# Patient Record
Sex: Female | Born: 1975 | Hispanic: Yes | Marital: Single | Smoking: Current some day smoker
Health system: Southern US, Community
[De-identification: ages and names within clinical notes are randomized; demographics above are authoritative.]

## PROBLEM LIST (undated history)

## (undated) DIAGNOSIS — M549 Dorsalgia, unspecified: Secondary | ICD-10-CM

## (undated) DIAGNOSIS — F32A Depression, unspecified: Secondary | ICD-10-CM

## (undated) DIAGNOSIS — F419 Anxiety disorder, unspecified: Secondary | ICD-10-CM

## (undated) DIAGNOSIS — F909 Attention-deficit hyperactivity disorder, unspecified type: Secondary | ICD-10-CM

## (undated) DIAGNOSIS — G43909 Migraine, unspecified, not intractable, without status migrainosus: Secondary | ICD-10-CM

## (undated) DIAGNOSIS — F319 Bipolar disorder, unspecified: Secondary | ICD-10-CM

## (undated) DIAGNOSIS — M5430 Sciatica, unspecified side: Secondary | ICD-10-CM

## (undated) DIAGNOSIS — F609 Personality disorder, unspecified: Secondary | ICD-10-CM

## (undated) DIAGNOSIS — K219 Gastro-esophageal reflux disease without esophagitis: Secondary | ICD-10-CM

## (undated) HISTORY — PX: TUBAL LIGATION: SHX77

## (undated) HISTORY — PX: BACK SURGERY: SHX140

## (undated) HISTORY — PX: ABDOMINAL HYSTERECTOMY: SHX81

## (undated) HISTORY — PX: APPENDECTOMY: SHX54

---

## 2014-03-09 HISTORY — PX: POSTERIOR LAMINECTOMY / DECOMPRESSION LUMBAR SPINE: SUR740

## 2015-07-14 ENCOUNTER — Encounter (HOSPITAL_BASED_OUTPATIENT_CLINIC_OR_DEPARTMENT_OTHER): Payer: Self-pay | Admitting: Emergency Medicine

## 2015-07-14 ENCOUNTER — Emergency Department (HOSPITAL_BASED_OUTPATIENT_CLINIC_OR_DEPARTMENT_OTHER): Payer: Self-pay

## 2015-07-14 ENCOUNTER — Emergency Department (HOSPITAL_BASED_OUTPATIENT_CLINIC_OR_DEPARTMENT_OTHER)
Admission: EM | Admit: 2015-07-14 | Discharge: 2015-07-14 | Disposition: A | Discharge status: Home / Self Care | Payer: Self-pay | Attending: Emergency Medicine | Admitting: Emergency Medicine

## 2015-07-14 DIAGNOSIS — H109 Unspecified conjunctivitis: Secondary | ICD-10-CM | POA: Insufficient documentation

## 2015-07-14 DIAGNOSIS — K122 Cellulitis and abscess of mouth: Secondary | ICD-10-CM | POA: Insufficient documentation

## 2015-07-14 DIAGNOSIS — F17290 Nicotine dependence, other tobacco product, uncomplicated: Secondary | ICD-10-CM | POA: Insufficient documentation

## 2015-07-14 HISTORY — DX: Dorsalgia, unspecified: M54.9

## 2015-07-14 LAB — BASIC METABOLIC PANEL
Anion gap: 5 (ref 5–15)
BUN: 9 mg/dL (ref 6–20)
CHLORIDE: 109 mmol/L (ref 101–111)
CO2: 26 mmol/L (ref 22–32)
CREATININE: 0.72 mg/dL (ref 0.44–1.00)
Calcium: 8.7 mg/dL — ABNORMAL LOW (ref 8.9–10.3)
Glucose, Bld: 93 mg/dL (ref 65–99)
Potassium: 4.2 mmol/L (ref 3.5–5.1)
SODIUM: 140 mmol/L (ref 135–145)

## 2015-07-14 LAB — CBC WITH DIFFERENTIAL/PLATELET
BASOS ABS: 0 10*3/uL (ref 0.0–0.1)
BASOS PCT: 0 %
Eosinophils Absolute: 0.4 10*3/uL (ref 0.0–0.7)
Eosinophils Relative: 3 %
HCT: 39.5 % (ref 36.0–46.0)
HEMOGLOBIN: 13.1 g/dL (ref 12.0–15.0)
Lymphocytes Relative: 25 %
Lymphs Abs: 3.8 10*3/uL (ref 0.7–4.0)
MCH: 30.4 pg (ref 26.0–34.0)
MCHC: 33.2 g/dL (ref 30.0–36.0)
MCV: 91.6 fL (ref 78.0–100.0)
MONO ABS: 1.1 10*3/uL — AB (ref 0.1–1.0)
Monocytes Relative: 7 %
NEUTROS ABS: 9.7 10*3/uL — AB (ref 1.7–7.7)
NEUTROS PCT: 64 %
Platelets: 276 10*3/uL (ref 150–400)
RBC: 4.31 MIL/uL (ref 3.87–5.11)
RDW: 15.1 % (ref 11.5–15.5)
WBC: 15 10*3/uL — ABNORMAL HIGH (ref 4.0–10.5)

## 2015-07-14 LAB — RAPID STREP SCREEN (MED CTR MEBANE ONLY): STREPTOCOCCUS, GROUP A SCREEN (DIRECT): NEGATIVE

## 2015-07-14 LAB — HCG, QUANTITATIVE, PREGNANCY

## 2015-07-14 MED ORDER — FENTANYL CITRATE (PF) 100 MCG/2ML IJ SOLN
100.0000 ug | Freq: Once | INTRAMUSCULAR | Status: AC
  Administered 2015-07-14: 100 ug via INTRAVENOUS
  Filled 2015-07-14: qty 2

## 2015-07-14 MED ORDER — SODIUM CHLORIDE 0.9 % IV SOLN
3.0000 g | Freq: Once | INTRAVENOUS | Status: AC
  Administered 2015-07-14: 3 g via INTRAVENOUS
  Filled 2015-07-14: qty 3

## 2015-07-14 MED ORDER — HYDROMORPHONE HCL 1 MG/ML IJ SOLN
1.0000 mg | Freq: Once | INTRAMUSCULAR | Status: AC
  Administered 2015-07-14: 1 mg via INTRAVENOUS
  Filled 2015-07-14: qty 1

## 2015-07-14 MED ORDER — DEXAMETHASONE SODIUM PHOSPHATE 10 MG/ML IJ SOLN
10.0000 mg | Freq: Once | INTRAMUSCULAR | Status: AC
  Administered 2015-07-14: 10 mg via INTRAVENOUS
  Filled 2015-07-14: qty 1

## 2015-07-14 MED ORDER — ONDANSETRON HCL 4 MG/2ML IJ SOLN
4.0000 mg | Freq: Once | INTRAMUSCULAR | Status: AC | PRN
Start: 2015-07-14 — End: 2015-07-14
  Administered 2015-07-14: 4 mg via INTRAVENOUS
  Filled 2015-07-14: qty 2

## 2015-07-14 MED ORDER — OXYCODONE-ACETAMINOPHEN 5-325 MG PO TABS: 1.0000 | ORAL_TABLET | ORAL | Status: DC | PRN

## 2015-07-14 MED ORDER — AMOXICILLIN-POT CLAVULANATE 875-125 MG PO TABS: 1.0000 | ORAL_TABLET | Freq: Two times a day (BID) | ORAL | Status: DC

## 2015-07-14 MED ORDER — IOPAMIDOL (ISOVUE-300) INJECTION 61%
100.0000 mL | Freq: Once | INTRAVENOUS | Status: AC | PRN
  Administered 2015-07-14: 100 mL via INTRAVENOUS

## 2015-07-14 MED ORDER — POLYMYXIN B-TRIMETHOPRIM 10000-0.1 UNIT/ML-% OP SOLN
1.0000 [drp] | OPHTHALMIC | Status: DC
Start: 2015-07-14 — End: 2015-11-14

## 2015-07-14 NOTE — Discharge Instructions (Signed)
1. Medications: augmentin, polytrim eye drops, pain medication, usual home medications 2. Treatment: rest, drink plenty of fluids 3. Follow Up: please followup with the ear nose and throat doctor (Dr. Ezzard StandingNewman) tomorrow at his office between 1:00 and 4:00 PM for discussion of your diagnoses and further evaluation after today's visit; if you do not have a primary care doctor use the phone number listed in your discharge paperwork to find one; please return to the ER for increased pain or swelling, difficulty breathing, new or worsening symptoms   Abscess An abscess (boil or furuncle) is an infected area on or under the skin. This area is filled with yellowish-white fluid (pus) and other material (debris). HOME CARE   Only take medicines as told by your doctor.  If you were given antibiotic medicine, take it as directed. Finish the medicine even if you start to feel better.  If gauze is used, follow your doctor's directions for changing the gauze.  To avoid spreading the infection:  Keep your abscess covered with a bandage.  Wash your hands well.  Do not share personal care items, towels, or whirlpools with others.  Avoid skin contact with others.  Keep your skin and clothes clean around the abscess.  Keep all doctor visits as told. GET HELP RIGHT AWAY IF:   You have more pain, puffiness (swelling), or redness in the wound site.  You have more fluid or blood coming from the wound site.  You have muscle aches, chills, or you feel sick.  You have a fever. MAKE SURE YOU:   Understand these instructions.  Will watch your condition.  Will get help right away if you are not doing well or get worse.   This information is not intended to replace advice given to you by your health care provider. Make sure you discuss any questions you have with your health care provider.   Document Released: 08/15/2007 Document Revised: 08/28/2011 Document Reviewed: 05/12/2011 Elsevier Interactive  Patient Education [employer]2016 Elsevier Inc.

## 2015-07-14 NOTE — ED Notes (Signed)
Patient reports that for the last three weeks her left throat and mouth hurt. She has had crust to her bilateral eyes and sore throat with ear ache

## 2015-07-14 NOTE — ED Provider Notes (Signed)
CSN: [id number]     Arrival date & time 07/14/15  1751 History   First MD Initiated Contact with Patient 07/14/15 1803     Chief Complaint  Patient presents with  . Dental Pain    HPI   Mariah Harris is a 40 y.o. female with a PMH of back pain who presents to the ED with left-sided throat and ear pain, which she states has progressively worsened over the past 3 weeks. She notes swallowing exacerbates her pain. She has tried over-the-counter pain medication and over-the-counter cough and cold medication with no significant symptom relief. She reports subjective fever and chills. She denies difficulty swallowing or trouble tolerating her secretions. She denies history of similar symptoms. She also notes crusting to her eyes bilaterally in the morning when she wakes up. She denies vision changes.   Past Medical History  Diagnosis Date  . Back pain    Past Surgical History  Procedure Laterality Date  . Back surgery     History reviewed. No pertinent family history. Social History  Substance Use Topics  . Smoking status: Current Some Day Smoker    Types: Cigars  . Smokeless tobacco: None  . Alcohol Use: No   OB History    No data available       Review of Systems  Constitutional: Positive for fever and chills.  HENT: Positive for sore throat. Negative for drooling and trouble swallowing.   Gastrointestinal: Negative for nausea, vomiting, abdominal pain and diarrhea.  All other systems reviewed and are negative.     Allergies  Asa  Home Medications   Prior to Admission medications   Medication Sig Start Date End Date Taking? Authorizing Provider  amoxicillin-clavulanate (AUGMENTIN) 875-125 MG tablet Take 1 tablet by mouth every 12 (twelve) hours. 07/14/15   Mady Gemma, PA-C  oxyCODONE-acetaminophen (PERCOCET/ROXICET) 5-325 MG tablet Take 1-2 tablets by mouth every 4 (four) hours as needed for severe pain. 07/14/15   Mady Gemma, PA-C   trimethoprim-polymyxin b (POLYTRIM) ophthalmic solution Place 1 drop into both eyes every 4 (four) hours. 07/14/15   Mady Gemma, PA-C    BP 116/83 mmHg  Pulse 73  Temp(Src) 98.4 F (36.9 C) (Oral)  Resp 18  Ht  (1.575 m)  Wt 58.968 kg  BMI 23.77 kg/m2  SpO2 100%  LMP 07/14/2015 Physical Exam  Constitutional: She is oriented to person, place, and time. She appears well-developed and well-nourished. No distress.  HENT:  Head: Normocephalic and atraumatic.  Right Ear: Hearing, tympanic membrane, external ear and ear canal normal.  Left Ear: Hearing, tympanic membrane, external ear and ear canal normal.  Nose: Nose normal.  Mouth/Throat: Uvula is midline and mucous membranes are normal. Posterior oropharyngeal edema and posterior oropharyngeal erythema present. No oropharyngeal exudate.  Left-sided posterior oropharyngeal edema.   Eyes: EOM and lids are normal. Pupils are equal, round, and reactive to light. Right eye exhibits no discharge. Left eye exhibits no discharge. Right conjunctiva is injected. Left conjunctiva is injected. No scleral icterus.  Neck: Normal range of motion. Neck supple.  Cardiovascular: Normal rate, regular rhythm, normal heart sounds, intact distal pulses and normal pulses.   Pulmonary/Chest: Effort normal and breath sounds normal. No respiratory distress. She has no wheezes. She has no rales.  Abdominal: Soft. Normal appearance and bowel sounds are normal. She exhibits no distension and no mass. There is no tenderness. There is no rigidity, no rebound and no guarding.  Musculoskeletal: Normal range of motion. She  exhibits no edema or tenderness.  Neurological: She is alert and oriented to person, place, and time.  Skin: Skin is warm, dry and intact. No rash noted. She is not diaphoretic. No erythema. No pallor.  Psychiatric: She has a normal mood and affect. Her speech is normal and behavior is normal.  Nursing note and vitals reviewed.   ED  Course  Procedures (including critical care time)  Labs Review Labs Reviewed  CBC WITH DIFFERENTIAL/PLATELET - Abnormal; Notable for the following:    WBC 15.0 (*)    Neutro Abs 9.7 (*)    Monocytes Absolute 1.1 (*)    All other components within normal limits  BASIC METABOLIC PANEL - Abnormal; Notable for the following:    Calcium 8.7 (*)    All other components within normal limits  RAPID STREP SCREEN (NOT AT [facility])  CULTURE, GROUP A STREP ([facility])  HCG, QUANTITATIVE, PREGNANCY    Imaging Review Ct Soft Tissue Neck W Contrast  07/14/2015  CLINICAL DATA:  Patient states like throat is closing off with difficulty swallowing. Patient complains of swelling to the LEFT side of the neck. Symptoms for 1-2 weeks. EXAM: CT NECK WITH CONTRAST TECHNIQUE: Multidetector CT imaging of the neck was performed using the standard protocol following the bolus administration of intravenous contrast. CONTRAST:  100mL ISOVUE-300 IOPAMIDOL (ISOVUE-300) INJECTION 61% COMPARISON:  None. FINDINGS: Pharynx and larynx: There is a 19 x 16 x 20 mm LEFT soft palate abscess low-attenuation central portion with estimated 3-4 mm rim enhancement, extending toward the LEFT parapharyngeal soft tissues, contiguous with the LEFT pterygoid muscles but not clearly invading the parapharyngeal space. There is mild mass effect on the airway and the posterior nasopharynx. The oropharynx and hypopharynx are widely patent. Normal epiglottis and base of tongue. Normal larynx. Salivary glands: Unremarkable Thyroid: Normal. Lymph nodes: Reactive cervical adenopathy, primarily BILATERAL level II, 9-10 mm short axis. Vascular: No vascular thrombosis. The LEFT jugular vein is smaller and may be slightly effaced. Limited intracranial: Negative. Visualized orbits: Negative. Mastoids and visualized paranasal sinuses: No acute sinus or mastoid disease. Skeleton: No worrisome osseous lesion. Upper chest: Early changes of COPD with apical blebs.  IMPRESSION: Roughly 2 cm LEFT soft palate abscess extending toward the LEFT parapharyngeal soft tissues. Surgical consultation is warranted to prevent parapharyngeal space extension. These results were called by telephone at the time of interpretation on 07/14/2015 at 8:42 pm to [facility] WESTFALL , who verbally acknowledged these results. Electronically Signed   By: Elsie StainJohn T Curnes M.D.   On: 07/14/2015 20:44   I have personally reviewed and evaluated these images and lab results as part of my medical decision-making.   EKG Interpretation None      MDM   Final diagnoses:  Abscess of palate  Bilateral conjunctivitis    40 year old female presents with left-sided throat and ear pain, which she notes has progressively worsened over the past 3 weeks. Reports associated subjective fever and chills. Denies difficulty swallowing or trouble tolerating her secretions. Also notes crusting to her eyes in the morning.   Patient is afebrile. Vital signs stable. Mild conjunctival injection bilaterally. TMs clear bilaterally. On exam, she has left-sided posterior oropharyngeal edema. Patient tolerating her secretions well. Heart regular rate and rhythm. Lungs clear to auscultation bilaterally. Abdomen soft, nontender, nondistended. Patient moves her extremities and ambulates without difficulty.  Labs pending. Patient given pain medication. Plan to obtain CT soft tissue neck to assess for possible abscess.  CBC remarkable for leukocytosis of 15. BMP unremarkable.  HCG negative. Rapid strep negative. CT remarkable for 2 cm left soft palate abscess extending toward the left parapharyngeal soft tissues. ENT consulted. Spoke with Dr. Ezzard Standing, who advised giving dose of IV unasyn and decadron in the ED and that the patient can follow-up in his clinic tomorrow afternoon. Will give augmentin for home as well as short course of pain medication. Will also give polytrim for bilateral conjunctivitis. Patient to follow up with  ENT tomorrow as above. Strict return precautions discussed. Patient verbalizes her understanding and is in agreement with plan  BP 116/83 mmHg  Pulse 73  Temp(Src) 98.4 F (36.9 C) (Oral)  Resp 18  Ht 5\' 2"  (1.575 m)  Wt 58.968 kg  BMI 23.77 kg/m2  SpO2 100%  LMP 07/14/2015     Mady Gemma, PA-C 07/14/15 2327  Lavera Guise, MD 07/15/15 [phone]

## 2015-07-16 LAB — CULTURE, GROUP A STREP (THRC)

## 2015-07-17 ENCOUNTER — Telehealth (HOSPITAL_BASED_OUTPATIENT_CLINIC_OR_DEPARTMENT_OTHER): Payer: Self-pay

## 2015-07-17 NOTE — Telephone Encounter (Signed)
Post ED Visit - Positive Culture Follow-up  Culture report reviewed by antimicrobial stewardship pharmacist:  []  Enzo BiNathan Batchelder, Pharm.D. []  Celedonio MiyamotoJeremy Frens, Pharm.D., BCPS []  Garvin FilaMike Maccia, Pharm.D. []  Georgina PillionElizabeth Martin, Pharm.D., BCPS []  MarvellMinh Pham, [address]Ph[address]D., BCPS, AAHIVP []  Estella HuskMichelle Turner, Pharm.D., BCPS, AAHIVP [x]  Tennis Mustassie Stewart, Pharm.D. []  Sherle Poeob Vincent, [address]Pharm[address]  Positive throat culture Treated with Augmentin, organism sensitive to the same and no further patient follow-up is required at this time.  Jerry CarasCullom, Endi Lagman Burnett 07/17/2015, 11:32 AM

## 2015-10-25 ENCOUNTER — Emergency Department (HOSPITAL_BASED_OUTPATIENT_CLINIC_OR_DEPARTMENT_OTHER)
Admission: EM | Admit: 2015-10-25 | Discharge: 2015-10-25 | Disposition: A | Discharge status: Home / Self Care | Payer: Medicaid Other | Attending: Emergency Medicine | Admitting: Emergency Medicine

## 2015-10-25 ENCOUNTER — Emergency Department (HOSPITAL_BASED_OUTPATIENT_CLINIC_OR_DEPARTMENT_OTHER): Payer: Medicaid Other

## 2015-10-25 ENCOUNTER — Encounter (HOSPITAL_BASED_OUTPATIENT_CLINIC_OR_DEPARTMENT_OTHER): Payer: Self-pay | Admitting: *Deleted

## 2015-10-25 DIAGNOSIS — F17210 Nicotine dependence, cigarettes, uncomplicated: Secondary | ICD-10-CM | POA: Insufficient documentation

## 2015-10-25 DIAGNOSIS — M5441 Lumbago with sciatica, right side: Secondary | ICD-10-CM | POA: Diagnosis not present

## 2015-10-25 DIAGNOSIS — M545 Low back pain: Secondary | ICD-10-CM | POA: Diagnosis present

## 2015-10-25 LAB — PREGNANCY, URINE: Preg Test, Ur: NEGATIVE

## 2015-10-25 MED ORDER — HYDROMORPHONE HCL 1 MG/ML IJ SOLN: 1.0000 mg | Freq: Once | INTRAMUSCULAR | Status: DC

## 2015-10-25 MED ORDER — DEXAMETHASONE SODIUM PHOSPHATE 10 MG/ML IJ SOLN
10.0000 mg | Freq: Once | INTRAMUSCULAR | Status: AC
  Administered 2015-10-25: 10 mg via INTRAVENOUS
  Filled 2015-10-25: qty 1

## 2015-10-25 MED ORDER — HYDROMORPHONE HCL 1 MG/ML IJ SOLN
1.0000 mg | Freq: Once | INTRAMUSCULAR | Status: AC
  Administered 2015-10-25: 1 mg via INTRAVENOUS
  Filled 2015-10-25: qty 1

## 2015-10-25 MED ORDER — HYDROMORPHONE HCL 1 MG/ML IJ SOLN
1.0000 mg | Freq: Once | INTRAMUSCULAR | Status: AC
  Administered 2015-10-25: 1 mg via INTRAMUSCULAR
  Filled 2015-10-25: qty 1

## 2015-10-25 MED ORDER — GABAPENTIN 300 MG PO CAPS: 300.0000 mg | ORAL_CAPSULE | Freq: Three times a day (TID) | ORAL | 0 refills | Status: DC

## 2015-10-25 MED ORDER — METHOCARBAMOL 500 MG PO TABS
1000.0000 mg | ORAL_TABLET | Freq: Once | ORAL | Status: AC
  Administered 2015-10-25: 1000 mg via ORAL
  Filled 2015-10-25: qty 2

## 2015-10-25 MED ORDER — DIAZEPAM 5 MG/ML IJ SOLN
5.0000 mg | Freq: Once | INTRAMUSCULAR | Status: AC
  Administered 2015-10-25: 5 mg via INTRAVENOUS
  Filled 2015-10-25: qty 2

## 2015-10-25 MED ORDER — LIDOCAINE 5 % EX PTCH: 1.0000 | MEDICATED_PATCH | CUTANEOUS | 0 refills | Status: AC

## 2015-10-25 NOTE — Discharge Instructions (Addendum)
You have been seen today for acute on chronic lower back pain. Your imaging showed no abnormalities. [facility]. They have walkin hours every Thursday beginning at 9 am. Return to ED as needed.

## 2015-10-25 NOTE — ED Triage Notes (Signed)
[place]. She has chronic pain and ran out of Percocet and Soma 3 months ago. She has c.o lower back pain.
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
## 2015-10-26 ENCOUNTER — Telehealth: Payer: Self-pay | Admitting: *Deleted

## 2015-11-01 ENCOUNTER — Encounter (HOSPITAL_COMMUNITY): Payer: Self-pay | Admitting: Emergency Medicine

## 2015-11-01 DIAGNOSIS — F17210 Nicotine dependence, cigarettes, uncomplicated: Secondary | ICD-10-CM | POA: Insufficient documentation

## 2015-11-01 DIAGNOSIS — N938 Other specified abnormal uterine and vaginal bleeding: Secondary | ICD-10-CM | POA: Diagnosis present

## 2015-11-01 DIAGNOSIS — Z5321 Procedure and treatment not carried out due to patient leaving prior to being seen by health care provider: Secondary | ICD-10-CM | POA: Diagnosis not present

## 2015-11-01 LAB — CBC WITH DIFFERENTIAL/PLATELET
Basophils Absolute: 0 10*3/uL (ref 0.0–0.1)
Basophils Relative: 0 %
Eosinophils Absolute: 0.5 10*3/uL (ref 0.0–0.7)
Eosinophils Relative: 4 %
HEMATOCRIT: 42.2 % (ref 36.0–46.0)
HEMOGLOBIN: 13.9 g/dL (ref 12.0–15.0)
LYMPHS ABS: 4.3 10*3/uL — AB (ref 0.7–4.0)
LYMPHS PCT: 35 %
MCH: 30.3 pg (ref 26.0–34.0)
MCHC: 32.9 g/dL (ref 30.0–36.0)
MCV: 92.1 fL (ref 78.0–100.0)
Monocytes Absolute: 1 10*3/uL (ref 0.1–1.0)
Monocytes Relative: 8 %
NEUTROS ABS: 6.6 10*3/uL (ref 1.7–7.7)
NEUTROS PCT: 53 %
Platelets: 239 10*3/uL (ref 150–400)
RBC: 4.58 MIL/uL (ref 3.87–5.11)
RDW: 14.4 % (ref 11.5–15.5)
WBC: 12.4 10*3/uL — ABNORMAL HIGH (ref 4.0–10.5)

## 2015-11-01 LAB — COMPREHENSIVE METABOLIC PANEL
ALT: 11 U/L — AB (ref 14–54)
ANION GAP: 5 (ref 5–15)
AST: 14 U/L — ABNORMAL LOW (ref 15–41)
Albumin: 4.1 g/dL (ref 3.5–5.0)
Alkaline Phosphatase: 52 U/L (ref 38–126)
BILIRUBIN TOTAL: 0.5 mg/dL (ref 0.3–1.2)
BUN: 8 mg/dL (ref 6–20)
CALCIUM: 9.1 mg/dL (ref 8.9–10.3)
CO2: 26 mmol/L (ref 22–32)
CREATININE: 1.18 mg/dL — AB (ref 0.44–1.00)
Chloride: 106 mmol/L (ref 101–111)
GFR calc non Af Amer: 57 mL/min — ABNORMAL LOW (ref >60)
Glucose, Bld: 93 mg/dL (ref 65–99)
Potassium: 3.7 mmol/L (ref 3.5–5.1)
Sodium: 137 mmol/L (ref 135–145)
TOTAL PROTEIN: 6.6 g/dL (ref 6.5–8.1)

## 2015-11-01 LAB — URINALYSIS, ROUTINE W REFLEX MICROSCOPIC
Bilirubin Urine: NEGATIVE
Glucose, UA: NEGATIVE mg/dL
Ketones, ur: 15 mg/dL — AB
Nitrite: NEGATIVE
Protein, ur: NEGATIVE mg/dL
Specific Gravity, Urine: 1.025 (ref 1.005–1.030)
pH: 5 (ref 5.0–8.0)

## 2015-11-01 LAB — URINE MICROSCOPIC-ADD ON

## 2015-11-01 LAB — POC URINE PREG, ED: Preg Test, Ur: NEGATIVE

## 2015-11-01 NOTE — ED Triage Notes (Signed)
Patient report vaginal bleeding onset Friday last week and right leg pain " charlie horse" , denies injury , no fever or chills .

## 2015-11-02 ENCOUNTER — Emergency Department (HOSPITAL_COMMUNITY)
Admission: EM | Admit: 2015-11-02 | Discharge: 2015-11-02 | Disposition: A | Discharge status: Home / Self Care | Payer: Medicaid Other | Attending: Emergency Medicine | Admitting: Emergency Medicine

## 2015-11-02 HISTORY — DX: Personality disorder, unspecified: F60.9

## 2015-11-02 HISTORY — DX: Bipolar disorder, unspecified: F31.9

## 2015-11-02 HISTORY — DX: Sciatica, unspecified side: M54.30

## 2015-11-02 NOTE — ED Notes (Signed)
Unable to locate patient for re-assessment  

## 2015-11-14 ENCOUNTER — Emergency Department (HOSPITAL_BASED_OUTPATIENT_CLINIC_OR_DEPARTMENT_OTHER)
Admission: EM | Admit: 2015-11-14 | Discharge: 2015-11-15 | Disposition: A | Discharge status: Home / Self Care | Payer: Medicaid Other | Attending: Emergency Medicine | Admitting: Emergency Medicine

## 2015-11-14 ENCOUNTER — Encounter (HOSPITAL_BASED_OUTPATIENT_CLINIC_OR_DEPARTMENT_OTHER): Payer: Self-pay

## 2015-11-14 DIAGNOSIS — F17200 Nicotine dependence, unspecified, uncomplicated: Secondary | ICD-10-CM | POA: Diagnosis not present

## 2015-11-14 DIAGNOSIS — M62838 Other muscle spasm: Secondary | ICD-10-CM | POA: Insufficient documentation

## 2015-11-14 DIAGNOSIS — M542 Cervicalgia: Secondary | ICD-10-CM | POA: Diagnosis present

## 2015-11-14 DIAGNOSIS — Z79899 Other long term (current) drug therapy: Secondary | ICD-10-CM | POA: Insufficient documentation

## 2015-11-14 LAB — CBG MONITORING, ED: Glucose-Capillary: 106 mg/dL — ABNORMAL HIGH (ref 65–99)

## 2015-11-14 MED ORDER — METHOCARBAMOL 1000 MG/10ML IJ SOLN
1000.0000 mg | Freq: Once | INTRAMUSCULAR | Status: AC
  Administered 2015-11-15: 1000 mg via INTRAVENOUS
  Filled 2015-11-14: qty 10

## 2015-11-14 MED ORDER — ONDANSETRON HCL 4 MG/2ML IJ SOLN
4.0000 mg | Freq: Once | INTRAMUSCULAR | Status: AC
  Administered 2015-11-15: 4 mg via INTRAVENOUS
  Filled 2015-11-14: qty 2

## 2015-11-14 MED ORDER — HYDROMORPHONE HCL 1 MG/ML IJ SOLN
1.0000 mg | Freq: Once | INTRAMUSCULAR | Status: AC
  Administered 2015-11-15: 1 mg via INTRAVENOUS
  Filled 2015-11-14: qty 1

## 2015-11-14 NOTE — ED Notes (Signed)
Back to room from w/r in w/c, pt alert, NAD, calm, interactive, moaning, guarding movements.

## 2015-11-14 NOTE — ED Notes (Signed)
[place] records (some paperwork) with pt, h/o lumbar back surgery, suppose to have cervical neck surgery, having difficulties with continuing meds and follow-up d/t recent move and medicaid. H/o percocet and soma, stopped ketoralac d/t bleeding, not taking any meds at this time except muscle relaxant and gabapentin, last taken at 1500. [facility]

## 2015-11-14 NOTE — ED Notes (Signed)
Was asked by reg clerk to check on pt-pt seated in w/c-continues to cry-daughter ? When pt would be seen-advised pt and daughter again that pt would be seen asap- both were notified of wait after triage
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
## 2015-11-14 NOTE — ED Notes (Signed)
Dr. Molpus into room 
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
## 2015-11-15 MED ORDER — BUPIVACAINE-EPINEPHRINE (PF) 0.5% -1:200000 IJ SOLN
1.8000 mL | Freq: Once | INTRAMUSCULAR | Status: AC
  Administered 2015-11-15: 1.8 mL
  Filled 2015-11-15: qty 1.8

## 2015-11-15 MED ORDER — METHOCARBAMOL 500 MG PO TABS: 1000.0000 mg | ORAL_TABLET | Freq: Four times a day (QID) | ORAL | 0 refills | Status: DC | PRN

## 2015-11-15 MED ORDER — OXYCODONE-ACETAMINOPHEN 5-325 MG PO TABS: 1.0000 | ORAL_TABLET | ORAL | 0 refills | Status: DC | PRN

## 2015-11-15 MED ORDER — ONDANSETRON 8 MG PO TBDP: 8.0000 mg | ORAL_TABLET | Freq: Three times a day (TID) | ORAL | 0 refills | Status: AC | PRN

## 2016-08-02 ENCOUNTER — Ambulatory Visit: Payer: Medicaid Other | Attending: Internal Medicine | Admitting: Physical Therapy

## 2016-08-02 DIAGNOSIS — M545 Low back pain: Secondary | ICD-10-CM | POA: Insufficient documentation

## 2016-08-02 DIAGNOSIS — G8929 Other chronic pain: Secondary | ICD-10-CM | POA: Insufficient documentation

## 2016-08-02 DIAGNOSIS — M546 Pain in thoracic spine: Secondary | ICD-10-CM | POA: Insufficient documentation

## 2016-08-02 DIAGNOSIS — M542 Cervicalgia: Secondary | ICD-10-CM | POA: Diagnosis not present
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
## 2016-08-02 NOTE — Patient Instructions (Addendum)
Screening for Suicide  Patient making comment during evaluation that she sometimes "just wants to give up" - therefore screen initiated   Answer the following questions with Yes or No and place an "x" beside the action taken.  1. Over the past two weeks, have you felt down, depressed, or hopeless?   yes  2. Within the past two weeks, have you felt little interest or pleasure in life?  no  If YES to either #1 or #2, then ask #3  3. Have you had thoughts that that life is not worth living or that you might be       better off dead?   no  If answer is NO and suspicion is low, then end   4. Over this past week, have you had any thoughts about hurting or even killing yourself?    If NO, then end. Patient in no immediate danger   5. If so, do you believe that you intend to or will harm yourself?       If NO, then end. Patient in no immediate danger   6.  Do you have a plan as to how you would hurt yourself?     7.  Over this past week, have you actually done anything to hurt yourself?    IF YES answers to either #4, #5, #6 or #7, then patient is AT RISK for suicide   Actions Taken  __X__  Screening negative; no further action required: reports she has children and grandchildren, would not consider self harm  ____  Screening positive; no immediate danger and patient already in treatment with a  mental health provider. Advise patient to speak to their mental health provider.  ____  Screening positive; no immediate danger. Patient advised to contact a mental  health provider for further assessment.   ____  Screening positive; in immediate danger as patient states intention of killing self,  has plan and a sense of imminence. Do not leave alone. Seek permission from  patient to contact a family member to inform them. Direct patient to go to ED.      Hamstring stretch    Left foot relaxed, knee straight, other leg bent, foot flat. Raise straight leg further upward to  maximal range. Hold _30__ seconds. Relax leg completely down. Repeat __3_ times.   Single knee to chest    Hug one knee to chest - hold 15-30 seconds; 3-5 times - repeat on both legs   Lower lumbar rotation   Knees bent, feet flat - let knees fall to one side,hold 5 seconds, let knees fall to other side and hold; repeat 10-15 times   Straight Leg Raise    Tighten stomach and slowly raise locked right leg _6-8___ inches from floor. Repeat __10-15__ times per set. Do __2__ sets per session.    Bridge   Lie back, legs bent. Inhale, pressing hips up. Keeping ribs in, lengthen lower back. Exhale, rolling down along spine from top. Repeat _10-15___ times. Do __2__ sessions per day.   Scapular Retraction (Standing)    With arms at sides, pinch shoulder blades together. Repeat __10-15__ times per set. Do __2__ sets per session.    Axial Extension (Chin Tuck)    Pull chin in and lengthen back of neck. Hold __510__ seconds while counting out loud. Repeat _10-15___ times. Do __2__ sessions per day.

## 2016-08-14 ENCOUNTER — Ambulatory Visit: Payer: Medicaid Other | Admitting: Physical Therapy

## 2016-08-20 ENCOUNTER — Ambulatory Visit: Payer: Medicaid Other | Attending: Internal Medicine | Admitting: Physical Therapy

## 2016-08-20 DIAGNOSIS — G8929 Other chronic pain: Secondary | ICD-10-CM | POA: Diagnosis present

## 2016-08-20 DIAGNOSIS — M542 Cervicalgia: Secondary | ICD-10-CM | POA: Insufficient documentation

## 2016-08-20 DIAGNOSIS — M546 Pain in thoracic spine: Secondary | ICD-10-CM | POA: Insufficient documentation

## 2016-08-20 DIAGNOSIS — M545 Low back pain: Secondary | ICD-10-CM | POA: Diagnosis present

## 2016-08-20 NOTE — Therapy (Addendum)
[facility] [address]  [place], [place], [address] Phone: [phone]   Fax:  [phone]  Physical Therapy Treatment  Patient Details  Name: Mariah Harris MRN: [id number] Date of Birth: 1975-08-28 Referring Provider: Dr. Benito Mccreedy  Encounter Date: 08/20/2016      PT End of Session - 08/20/16 0857    Visit Number 2   Authorization Type Adult Medicaid (1 eval, 1 treatment)   PT Start Time 0847   PT Stop Time 0930   PT Time Calculation (min) 43 min   Activity Tolerance Patient tolerated treatment well   Behavior During Therapy [facility] for tasks assessed/performed      Past Medical History:  Diagnosis Date  . Back pain   . Bipolar 1 disorder ([place])   . Personality disorder   . Sciatica     Past Surgical History:  Procedure Laterality Date  . BACK SURGERY    . POSTERIOR LAMINECTOMY / DECOMPRESSION LUMBAR SPINE Bilateral 03/09/2014  . TUBAL LIGATION      There were no vitals filed for this visit.      Subjective Assessment - 08/20/16 0850    Subjective Patient in pain today - relating some pain to menstrual cycle. has been doing HEP. Continues to report spasming from neck to midback. Has lost balance but without fall.    Diagnostic tests MRI of spine: not in chart: patient reporting bulging discs, "tears", arthritis   Patient Stated Goals improve pain, return to normal living   Currently in Pain? Yes   Pain Score 8    Pain Location Back   Pain Orientation Mid;Medial   Pain Descriptors / Indicators Aching;Discomfort;Sharp   Pain Type Chronic pain                         [facility] Adult PT Treatment/Exercise - 08/20/16 0859      Exercises   Exercises Lumbar     Lumbar Exercises: Stretches   Single Knee to Chest Stretch 3 reps;20 seconds   Lower Trunk Rotation Limitations 15 reps x 5 seconds     Lumbar Exercises: Aerobic   Stationary Bike Nustep L3 x 5 mintues     Lumbar  Exercises: Seated   Other Seated Lumbar Exercises resistive band rowing - red tband x 10 reps     Lumbar Exercises: Supine   Ab Set 15 reps;5 seconds   Clam 10 reps   Clam Limitations red tband   Bridge 10 reps     Lumbar Exercises: Sidelying   Clam 15 reps   Clam Limitations L LE only                     PT Long Term Goals - 08/20/16 0973      PT LONG TERM GOAL #1   Title Patient to be independent with advanced HEP (08/24/16)   Status Achieved     PT LONG TERM GOAL #2   Title Patient to perform cervical and lumbar AROM to WNL with pain no greater than 3/10 (08/24/16)   Status Not Met     PT LONG TERM GOAL #3   Title Patient to verbalize other avenues of PT available to her, i.e. [facility] pro bono clinic (08/24/16)   Status Achieved     PT LONG TERM GOAL #4   Title Patient to report ability to return to leisure activities to allow for improved quality of life with  improved pain symptoms (08/24/16)   Status Not Met               Plan - 08/20/16 0858    Clinical Impression Statement [name] today reporting high levels of pain - of which she partially correlates to her menstrual cycle. Patient continues to report "spasming" from C-spine to L-spine that interferes with daily activities. Patient requiring heavy VC as well as motivation to participate and complete tasks, as patient becomes tearful when discussing her current pain levels and difficulties due to pain. Patient today given updated HEP with theraband as well as handout for [facility] pro bono clinic as PT feels she would greatly benefit from continued PT services. Patient d/c from therapy today with understanding to continue to utilize HEP as tolerated and to follow-up with [facility].    PT Treatment/Interventions ADLs/Self Care Home Management;Cryotherapy;Electrical Stimulation;Moist Heat;Traction;Ultrasound;Neuromuscular re-education;Balance training;Therapeutic exercise;Therapeutic activities;Functional mobility  training;Patient/family education;Manual techniques;Passive range of motion;Vasopneumatic Device;Taping;Dry needling   Consulted and Agree with Plan of Care Patient      Patient will benefit from skilled therapeutic intervention in order to improve the following deficits and impairments:  Abnormal gait, Decreased activity tolerance, Decreased range of motion, Decreased mobility, Decreased strength, Difficulty walking, Postural dysfunction, Pain  Visit Diagnosis: Cervicalgia  Pain in thoracic spine  Chronic low back pain, unspecified back pain laterality, with sciatica presence unspecified     Problem List There are no active problems to display for this patient.   Lanney Gins, PT, DPT 08/20/16 10:31 AM  PHYSICAL THERAPY DISCHARGE SUMMARY    Current functional level related to goals / functional outcomes: See above   Remaining deficits: See above   Education / Equipment: HEP  Plan: Patient agrees to discharge.  Patient goals were partially met. Patient is being discharged due to financial reasons.  ?????     Lanney Gins, PT, DPT 08/27/16 9:15 AM   [facility] [address]  [place], [place], [address] Phone: [phone]   Fax:  [phone]  Name: Mariah Harris MRN: [id number] Date of Birth: [birth year]

## 2016-08-20 NOTE — Patient Instructions (Signed)
    Resistive Band Rowing   With resistive band anchored in door, grasp both ends. Keeping elbows bent, pull back, squeezing shoulder blades together. Hold __5-10__ seconds. Repeat __15__ times. Do ____ sessions per day.   Clam Shell 45 Degrees   Lying with hips and knees bent 45, one pillow between knees and ankles. Lift knee. Be sure pelvis does not roll backward. Do not arch back. Do _15__ times, each leg, __2_ times per day.   Hip Abduction / Adduction: with Knee Flexion (Supine)   With BOTH knees bent, gently lower knee to side and return. Repeat _10-15___ times per set. Do _2___ sets per session.

## 2018-12-19 ENCOUNTER — Other Ambulatory Visit: Payer: Self-pay

## 2018-12-19 DIAGNOSIS — Z20822 Contact with and (suspected) exposure to covid-19: Secondary | ICD-10-CM

## 2018-12-21 LAB — NOVEL CORONAVIRUS, NAA: SARS-CoV-2, NAA: NOT DETECTED

## 2019-07-03 ENCOUNTER — Other Ambulatory Visit: Payer: Self-pay

## 2019-07-03 ENCOUNTER — Emergency Department (HOSPITAL_BASED_OUTPATIENT_CLINIC_OR_DEPARTMENT_OTHER): Payer: Medicaid Other

## 2019-07-03 ENCOUNTER — Encounter (HOSPITAL_BASED_OUTPATIENT_CLINIC_OR_DEPARTMENT_OTHER): Payer: Self-pay

## 2019-07-03 ENCOUNTER — Emergency Department (HOSPITAL_BASED_OUTPATIENT_CLINIC_OR_DEPARTMENT_OTHER)
Admission: EM | Admit: 2019-07-03 | Discharge: 2019-07-03 | Disposition: A | Discharge status: Home / Self Care | Payer: Medicaid Other | Attending: Emergency Medicine | Admitting: Emergency Medicine

## 2019-07-03 DIAGNOSIS — N898 Other specified noninflammatory disorders of vagina: Secondary | ICD-10-CM | POA: Diagnosis not present

## 2019-07-03 DIAGNOSIS — Z79899 Other long term (current) drug therapy: Secondary | ICD-10-CM | POA: Insufficient documentation

## 2019-07-03 DIAGNOSIS — E860 Dehydration: Secondary | ICD-10-CM | POA: Diagnosis not present

## 2019-07-03 DIAGNOSIS — R1033 Periumbilical pain: Secondary | ICD-10-CM | POA: Insufficient documentation

## 2019-07-03 DIAGNOSIS — F17200 Nicotine dependence, unspecified, uncomplicated: Secondary | ICD-10-CM | POA: Diagnosis not present

## 2019-07-03 DIAGNOSIS — N760 Acute vaginitis: Secondary | ICD-10-CM | POA: Insufficient documentation

## 2019-07-03 DIAGNOSIS — R1030 Lower abdominal pain, unspecified: Secondary | ICD-10-CM | POA: Diagnosis present

## 2019-07-03 HISTORY — DX: Migraine, unspecified, not intractable, without status migrainosus: G43.909

## 2019-07-03 LAB — CBC
HCT: 41.6 % (ref 36.0–46.0)
Hemoglobin: 14.3 g/dL (ref 12.0–15.0)
MCH: 30 pg (ref 26.0–34.0)
MCHC: 34.4 g/dL (ref 30.0–36.0)
MCV: 87.2 fL (ref 80.0–100.0)
Platelets: 309 10*3/uL (ref 150–400)
RBC: 4.77 MIL/uL (ref 3.87–5.11)
RDW: 13.2 % (ref 11.5–15.5)
WBC: 16.3 10*3/uL — ABNORMAL HIGH (ref 4.0–10.5)
nRBC: 0 % (ref 0.0–0.2)

## 2019-07-03 LAB — URINALYSIS, ROUTINE W REFLEX MICROSCOPIC
Glucose, UA: NEGATIVE mg/dL
Ketones, ur: 15 mg/dL — AB
Nitrite: NEGATIVE
Protein, ur: NEGATIVE mg/dL
Specific Gravity, Urine: >1.03 — ABNORMAL HIGH (ref 1.005–1.030)
pH: 5.5 (ref 5.0–8.0)

## 2019-07-03 LAB — COMPREHENSIVE METABOLIC PANEL
ALT: 13 U/L (ref 0–44)
AST: 23 U/L (ref 15–41)
Albumin: 4.4 g/dL (ref 3.5–5.0)
Alkaline Phosphatase: 62 U/L (ref 38–126)
Anion gap: 11 (ref 5–15)
BUN: 10 mg/dL (ref 6–20)
CO2: 26 mmol/L (ref 22–32)
Calcium: 9.6 mg/dL (ref 8.9–10.3)
Chloride: 101 mmol/L (ref 98–111)
Creatinine, Ser: 1.12 mg/dL — ABNORMAL HIGH (ref 0.44–1.00)
GFR calc Af Amer: >60 mL/min (ref >60)
GFR calc non Af Amer: 60 mL/min — ABNORMAL LOW (ref >60)
Glucose, Bld: 132 mg/dL — ABNORMAL HIGH (ref 70–99)
Potassium: 3.9 mmol/L (ref 3.5–5.1)
Sodium: 138 mmol/L (ref 135–145)
Total Bilirubin: 1.2 mg/dL (ref 0.3–1.2)
Total Protein: 8 g/dL (ref 6.5–8.1)

## 2019-07-03 LAB — URINALYSIS, MICROSCOPIC (REFLEX)

## 2019-07-03 LAB — LIPASE, BLOOD: Lipase: 27 U/L (ref 11–51)

## 2019-07-03 MED ORDER — MORPHINE SULFATE (PF) 4 MG/ML IV SOLN
4.0000 mg | Freq: Once | INTRAVENOUS | Status: AC
  Administered 2019-07-03: 4 mg via INTRAVENOUS
  Filled 2019-07-03: qty 1

## 2019-07-03 MED ORDER — CLINDAMYCIN HCL 150 MG PO CAPS
300.0000 mg | ORAL_CAPSULE | Freq: Once | ORAL | Status: AC
  Administered 2019-07-03: 300 mg via ORAL
  Filled 2019-07-03: qty 2

## 2019-07-03 MED ORDER — LACTATED RINGERS IV BOLUS
1000.0000 mL | Freq: Once | INTRAVENOUS | Status: AC
  Administered 2019-07-03: 1000 mL via INTRAVENOUS

## 2019-07-03 MED ORDER — HYDROMORPHONE HCL 1 MG/ML IJ SOLN
1.0000 mg | Freq: Once | INTRAMUSCULAR | Status: AC
  Administered 2019-07-03: 22:00:00 1 mg via INTRAVENOUS
  Filled 2019-07-03: qty 1

## 2019-07-03 MED ORDER — IOHEXOL 300 MG/ML  SOLN
100.0000 mL | Freq: Once | INTRAMUSCULAR | Status: AC | PRN
  Administered 2019-07-03: 100 mL via INTRAVENOUS

## 2019-07-03 MED ORDER — OXYCODONE-ACETAMINOPHEN 5-325 MG PO TABS: 1.0000 | ORAL_TABLET | Freq: Four times a day (QID) | ORAL | 0 refills | Status: DC | PRN

## 2019-07-03 MED ORDER — ONDANSETRON HCL 4 MG/2ML IJ SOLN
4.0000 mg | Freq: Once | INTRAMUSCULAR | Status: AC
  Administered 2019-07-03: 4 mg via INTRAVENOUS
  Filled 2019-07-03: qty 2

## 2019-07-03 MED ORDER — CLINDAMYCIN HCL 300 MG PO CAPS
300.0000 mg | ORAL_CAPSULE | Freq: Three times a day (TID) | ORAL | 0 refills | Status: DC
Start: 2019-07-03 — End: 2021-12-06

## 2019-07-03 NOTE — ED Provider Notes (Signed)
In she was having some pain even a week after surgery but it has significantly worsened in the last 4 days.  She saw her PCP and at that time was found to have an elevated white count [facility] EM[facility]GENCY DEPARTMENT Provider Note   CSN: [id number] Arrival date & time: 07/03/19  1829     History Chief Complaint  Patient presents with  . Abdominal Pain    Mariah Harris is a 44 y.o. female.  The history is provided by the patient.  Abdominal Pain Pain location:  Suprapubic and periumbilical Pain quality: aching, fullness, gnawing and sharp   Pain radiates to:  Does not radiate Pain severity:  Severe Onset quality:  Gradual Duration:  3 weeks Timing:  Constant Progression:  Worsening Chronicity:  New Context comment:  Patient had a hysterectomy 1 April and was having some discomfort and problems afterwards that have gradually worsened and in the last 4 days she has had significant abdominal pain, fever, foul vaginal discharge, nausea and vomiting Relieved by:  Nothing Worsened by:  Movement, vomiting and bowel movements Ineffective treatments:  Acetaminophen and OTC medications Associated symptoms: anorexia, constipation, cough, fever, nausea, vaginal bleeding, vaginal discharge and vomiting   Associated symptoms: no chest pain, no diarrhea, no dysuria, no hematuria and no shortness of breath   Vaginal bleeding:    Quality: some blood and foul smelling discharge.   Number of pads used:  Panty liner   Onset quality:  Gradual   Duration:  4 days   Progression:  Waxing and waning   Chronicity:  New Risk factors comment:  Recent hysterectomy 06/11/19      Past Medical History:  Diagnosis Date  . Back pain   . Bipolar 1 disorder (HCC)   . Migraine   . Personality disorder (HCC)   . Sciatica     There are no problems to display for this patient.   Past Surgical History:  Procedure Laterality Date  . ABDOMINAL HYSTERECTOMY    . BACK SURGERY    . POSTERIOR  LAMINECTOMY / DECOMPRESSION LUMBAR SPINE Bilateral 03/09/2014  . TUBAL LIGATION       OB History    Gravida  4   Para  4   Term      Preterm      AB      Living        SAB      TAB      Ectopic      Multiple      Live Births              No family history on file.  Social History   Tobacco Use  . Smoking status: Current Some Day Smoker  . Smokeless tobacco: Never Used  Substance Use Topics  . Alcohol use: No  . Drug use: Not Currently    Types: Marijuana    Home Medications Prior to Admission medications   Medication Sig Start Date End Date Taking? Authorizing Provider  budesonide-formoterol (SYMBICORT) 80-4.5 MCG/ACT inhaler Inhale into the lungs.    [provider]  buPROPion (WELLBUTRIN SR) 150 MG 12 hr tablet Take by mouth.    [provider]  DULoxetine (CYMBALTA) 60 MG capsule Take by mouth.    [provider]  gabapentin (NEURONTIN) 300 MG capsule Take 1 capsule (300 mg total) by mouth 3 (three) times daily. 10/25/15   Joy, Shawn C, PA-C  hydrOXYzine Pamoate POWD 50 mg by Misc.(Non-Drug; Combo Route)  route nightly.    [provider]  lidocaine (LIDODERM) 5 % Place 1 patch onto the skin daily. Remove & Discard patch within 12 hours or as directed by MD 10/25/15   Harolyn Rutherford C, PA-C  meloxicam (MOBIC) 15 MG tablet Take by mouth.    [provider]  methocarbamol (ROBAXIN) 500 MG tablet Take 2 tablets (1,000 mg total) by mouth every 6 (six) hours as needed for muscle spasms. 11/15/15   Molpus, Jonny Ruiz, MD  ondansetron (ZOFRAN ODT) 8 MG disintegrating tablet Take 1 tablet (8 mg total) by mouth every 8 (eight) hours as needed for nausea or vomiting. 11/15/15   Molpus, Jonny Ruiz, MD  oxyCODONE-acetaminophen (PERCOCET) 5-325 MG tablet Take 1-2 tablets by mouth every 4 (four) hours as needed (for pain). 11/15/15   Molpus, John, MD  sulindac (CLINORIL) 200 MG tablet Take by mouth.    [provider]  tiZANidine  (ZANAFLEX) 4 MG capsule Take by mouth.    [provider]    Allergies    Asa [aspirin]  Review of Systems   Review of Systems  Constitutional: Positive for fever.  Respiratory: Positive for cough. Negative for shortness of breath.   Cardiovascular: Negative for chest pain.  Gastrointestinal: Positive for abdominal pain, anorexia, constipation, nausea and vomiting. Negative for diarrhea.  Genitourinary: Positive for vaginal bleeding and vaginal discharge. Negative for dysuria and hematuria.  All other systems reviewed and are negative.   Physical Exam Updated Vital Signs BP (!) 131/99 (BP Location: Right Arm)   Pulse 100   Temp 98.1 F (36.7 C) (Oral)   Resp 16   Ht 5\' 2"  (1.575 m)   Wt 53.1 kg   LMP 10/12/2015   SpO2 100%   BMI 21.40 kg/m   Physical Exam Vitals and nursing note reviewed.  Constitutional:      General: She is in acute distress.     Appearance: She is well-developed and normal weight.  HENT:     Head: Normocephalic and atraumatic.     Mouth/Throat:     Mouth: Mucous membranes are dry.  Eyes:     Pupils: Pupils are equal, round, and reactive to light.  Cardiovascular:     Rate and Rhythm: Regular rhythm. Tachycardia present.     Heart sounds: Normal heart sounds. No murmur. No friction rub.  Pulmonary:     Effort: Pulmonary effort is normal.     Breath sounds: Normal breath sounds. No wheezing or rales.  Abdominal:     General: Bowel sounds are normal. There is distension.     Palpations: Abdomen is soft.     Tenderness: There is abdominal tenderness in the right lower quadrant, periumbilical area, suprapubic area and left lower quadrant. There is guarding. There is no right CVA tenderness, left CVA tenderness or rebound.     Hernia: No hernia is present.     Comments: Surgical incisions from trocar are healing well without surrounding erythema or drainage.  No wound dehiscence  Musculoskeletal:        General: No tenderness. Normal range  of motion.     Right lower leg: No edema.     Left lower leg: No edema.     Comments: No edema  Skin:    General: Skin is warm and dry.     Findings: No rash.  Neurological:     Mental Status: She is alert and oriented to person, place, and time.     Cranial Nerves: No cranial nerve  deficit.     Comments: Tearful on exam  Psychiatric:        Behavior: Behavior normal.     ED Results / Procedures / Treatments   Labs (all labs ordered are listed, but only abnormal results are displayed) Labs Reviewed  COMPREHENSIVE METABOLIC PANEL - Abnormal; Notable for the following components:      Result Value   Glucose, Bld 132 (*)    Creatinine, Ser 1.12 (*)    GFR calc non Af Amer 60 (*)    All other components within normal limits  CBC - Abnormal; Notable for the following components:   WBC 16.3 (*)    All other components within normal limits  URINALYSIS, ROUTINE W REFLEX MICROSCOPIC - Abnormal; Notable for the following components:   Specific Gravity, Urine >1.030 (*)    Hgb urine dipstick SMALL (*)    Bilirubin Urine SMALL (*)    Ketones, ur 15 (*)    Leukocytes,Ua SMALL (*)    All other components within normal limits  URINALYSIS, MICROSCOPIC (REFLEX) - Abnormal; Notable for the following components:   Bacteria, UA RARE (*)    All other components within normal limits  LIPASE, BLOOD    EKG None  Radiology CT ABDOMEN PELVIS W CONTRAST  Result Date: 07/03/2019 CLINICAL DATA:  Abdominal pain, recent hysterectomy 4/1 vaginal spotting and bleeding with fever EXAM: CT ABDOMEN AND PELVIS WITH CONTRAST TECHNIQUE: Multidetector CT imaging of the abdomen and pelvis was performed using the standard protocol following bolus administration of intravenous contrast. CONTRAST:  110mL OMNIPAQUE IOHEXOL 300 MG/ML  SOLN COMPARISON:  None. FINDINGS: Lower chest: The visualized heart size within normal limits. No pericardial fluid/thickening. No hiatal hernia. The visualized portions of the lungs  are clear. Hepatobiliary: There is a tiny 5 mm low-density lesion seen in the anterior left liver lobe.The main portal vein is patent. No evidence of calcified gallstones, gallbladder wall thickening or biliary dilatation. Pancreas: Unremarkable. No pancreatic ductal dilatation or surrounding inflammatory changes. Spleen: Normal in size without focal abnormality. Adrenals/Urinary Tract: Both adrenal glands appear normal. The kidneys and collecting system appear normal without evidence of urinary tract calculus or hydronephrosis. Bladder is unremarkable. Stomach/Bowel: The stomach, small bowel, and colon are normal in appearance. No inflammatory changes, wall thickening, or obstructive findings. Vascular/Lymphatic: There are no enlarged mesenteric, retroperitoneal, or pelvic lymph nodes. Scattered aortic atherosclerosis seen within the distal aorta at the aorta bi iliac bifurcation. Reproductive: The patient is status post hysterectomy. The vaginal cuff appears to be diffusely thickened, likely postsurgical changes. No enhancing loculated fluid collections or surrounding free air is seen. No definite hematoma is noted. Other: Mild soft tissue stranding along the anterior abdominal wall, likely from postsurgical changes. Musculoskeletal: No acute or significant osseous findings. IMPRESSION: Status post hysterectomy with probable postsurgical changes at the vaginal cuff. No definite abscess or surrounding hematoma. Mild aortic Atherosclerosis (ICD10-I70.0). Electronically Signed   By: Prudencio Pair M.D.   On: 07/03/2019 20:57   DG Chest Port 1 View  Result Date: 07/03/2019 CLINICAL DATA:  Cough. Patient reports bloody sputum. Recent hysterectomy. EXAM: PORTABLE CHEST 1 VIEW COMPARISON:  Lung bases from abdominal CT earlier this day. FINDINGS: The cardiomediastinal contours are normal. The lungs are clear. Pulmonary vasculature is normal. No consolidation, pleural effusion, or pneumothorax. No acute osseous  abnormalities are seen. IMPRESSION: Negative portable AP view of the chest. Electronically Signed   By: Keith Rake M.D.   On: 07/03/2019 21:15    Procedures Procedures (  including critical care time)  Medications Ordered in ED Medications  lactated ringers bolus 1,000 mL (has no administration in time range)  ondansetron (ZOFRAN) injection 4 mg (4 mg Intravenous Given 07/03/19 2015)  morphine 4 MG/ML injection 4 mg (4 mg Intravenous Given 07/03/19 2015)  iohexol (OMNIPAQUE) 300 MG/ML solution 100 mL (100 mLs Intravenous Contrast Given 07/03/19 2030)    ED Course  I have reviewed the triage vital signs and the nursing notes.  Pertinent labs & imaging results that were available during my care of the patient were reviewed by me and considered in my medical decision making (see chart for details).    MDM Rules/Calculators/A&P                      Patient is a 44 year old female presenting with worsening abdominal pain in the last 4 days with fever and foul-smelling intermittent vaginal bleeding.  Patient had a hysterectomy done approximately 3 weeks agoIn she was having some pain even a week after surgery but it has significantly worsened in the last 4 days.  She saw her PCP and at that time was found to have an elevated white count and she made an appointment to see her OB/GYN.  However she was still having pain and spoke with her PCP again today because the OB/GYN had to go to surgery and was unable to see her.  At that time they recommended she come to the emergency room for further evaluation. Patient has significant abdominal pain on exam with guarding.  No evidence of surgical site infection but concern for abscess or other complications.  Patient has recently started having nausea and vomiting in the last 4 days as well.  She has been eating very little and reports anytime she eats her abdomen hurts worse.  She has not had a normal bowel movement since the surgery but is passing  flatus. CBC today with leukocytosis of 16,000, CMP with mild AKI most likely related to dehydration and UA which is slightly contaminated but no significant findings for UTI.  Patient given IV fluids, pain and nausea medication.  CT abdomen pelvis with contrast pending.  9:40 PM CT of the abdomen and pelvis shows probable postsurgical changes at the vaginal cuff but no definitive abscess or surrounding hematoma.  Chest x-ray is within normal limits.  10:53 PM Spoke with OB/GYN for Dr. Eliane Decree practice and he recommended starting clindamycin in case patient is developing a cuff cellulitis.  On reevaluation after fluids and pain control patient is feeling much better.  We will plan on discharging home with clindamycin and pain control.  Patient will follow up with Dr. Allena Katz on Monday.  MDM Number of Diagnoses or Management Options Vaginal cuff cellulitis: new, needed workup   Amount and/or Complexity of Data Reviewed Clinical lab tests: ordered and reviewed Tests in the radiology section of CPT: ordered and reviewed Tests in the medicine section of CPT: ordered and reviewed Decide to obtain previous medical records or to obtain history from someone other than the patient: yes Obtain history from someone other than the patient: yes Review and summarize past medical records: yes Discuss the patient with other providers: yes Independent visualization of images, tracings, or specimens: yes  Risk of Complications, Morbidity, and/or Mortality Presenting problems: moderate Diagnostic procedures: low Management options: low  Patient Progress Patient progress: improved   Final Clinical Impression(s) / ED Diagnoses Final diagnoses:  Vaginal cuff cellulitis  Dehydration    Rx / DC  Orders ED Discharge Orders         Ordered    oxyCODONE-acetaminophen (PERCOCET) 5-325 MG tablet  Every 6 hours PRN     07/03/19 2257    clindamycin (CLEOCIN) 300 MG capsule  3 times daily     07/03/19 2257            Gwyneth Sprout, MD 07/03/19 2259

## 2019-07-03 NOTE — ED Notes (Signed)
Pt to CT scan.

## 2019-07-03 NOTE — ED Triage Notes (Signed)
Pt states she had hysterectomy 4/1-has had abd pain since surgery and vaginal bleeding-reports last normal BM before surgery-states she had surgical f/u and PCP-last PCP visit today and sent to ED-to triage via w/c-griamcing

## 2019-07-03 NOTE — Discharge Instructions (Signed)
The labs today did show an elevated white blood cell count but your CAT scan otherwise look okay without abscess or other significant findings.  Spoke with the OB/GYN and on-call for Dr. Allena Katz and they recommended starting an antibiotic in case you were starting to get an infection.  Take the clindamycin 3 times a day until you follow-up with Dr. Allena Katz.  If over the weekend you start running a high fever, persistent vomiting or severe pain please return to the emergency room.

## 2019-08-25 ENCOUNTER — Other Ambulatory Visit: Payer: Self-pay

## 2019-08-25 ENCOUNTER — Encounter (HOSPITAL_BASED_OUTPATIENT_CLINIC_OR_DEPARTMENT_OTHER): Payer: Self-pay

## 2019-08-25 ENCOUNTER — Emergency Department (HOSPITAL_BASED_OUTPATIENT_CLINIC_OR_DEPARTMENT_OTHER): Payer: Medicaid Other

## 2019-08-25 ENCOUNTER — Emergency Department (HOSPITAL_BASED_OUTPATIENT_CLINIC_OR_DEPARTMENT_OTHER)
Admission: EM | Admit: 2019-08-25 | Discharge: 2019-08-25 | Disposition: A | Discharge status: Home / Self Care | Payer: Medicaid Other | Attending: Emergency Medicine | Admitting: Emergency Medicine

## 2019-08-25 DIAGNOSIS — F17200 Nicotine dependence, unspecified, uncomplicated: Secondary | ICD-10-CM | POA: Diagnosis not present

## 2019-08-25 DIAGNOSIS — G43901 Migraine, unspecified, not intractable, with status migrainosus: Secondary | ICD-10-CM

## 2019-08-25 DIAGNOSIS — R112 Nausea with vomiting, unspecified: Secondary | ICD-10-CM | POA: Insufficient documentation

## 2019-08-25 DIAGNOSIS — M545 Low back pain: Secondary | ICD-10-CM | POA: Diagnosis not present

## 2019-08-25 DIAGNOSIS — G8929 Other chronic pain: Secondary | ICD-10-CM | POA: Insufficient documentation

## 2019-08-25 DIAGNOSIS — R630 Anorexia: Secondary | ICD-10-CM | POA: Insufficient documentation

## 2019-08-25 DIAGNOSIS — G43909 Migraine, unspecified, not intractable, without status migrainosus: Secondary | ICD-10-CM | POA: Insufficient documentation

## 2019-08-25 LAB — BASIC METABOLIC PANEL
Anion gap: 9 (ref 5–15)
BUN: 12 mg/dL (ref 6–20)
CO2: 24 mmol/L (ref 22–32)
Calcium: 8.8 mg/dL — ABNORMAL LOW (ref 8.9–10.3)
Chloride: 104 mmol/L (ref 98–111)
Creatinine, Ser: 0.95 mg/dL (ref 0.44–1.00)
GFR calc Af Amer: >60 mL/min (ref >60)
GFR calc non Af Amer: >60 mL/min (ref >60)
Glucose, Bld: 90 mg/dL (ref 70–99)
Potassium: 3.9 mmol/L (ref 3.5–5.1)
Sodium: 137 mmol/L (ref 135–145)

## 2019-08-25 LAB — CBC
HCT: 38.4 % (ref 36.0–46.0)
Hemoglobin: 12.7 g/dL (ref 12.0–15.0)
MCH: 29.7 pg (ref 26.0–34.0)
MCHC: 33.1 g/dL (ref 30.0–36.0)
MCV: 89.9 fL (ref 80.0–100.0)
Platelets: 279 10*3/uL (ref 150–400)
RBC: 4.27 MIL/uL (ref 3.87–5.11)
RDW: 14.6 % (ref 11.5–15.5)
WBC: 8.6 10*3/uL (ref 4.0–10.5)
nRBC: 0 % (ref 0.0–0.2)

## 2019-08-25 MED ORDER — PROMETHAZINE HCL 25 MG/ML IJ SOLN
25.0000 mg | Freq: Once | INTRAMUSCULAR | Status: AC
  Administered 2019-08-25: 25 mg via INTRAVENOUS
  Filled 2019-08-25: qty 1

## 2019-08-25 MED ORDER — KETOROLAC TROMETHAMINE 30 MG/ML IJ SOLN
30.0000 mg | Freq: Once | INTRAMUSCULAR | Status: AC
  Administered 2019-08-25: 30 mg via INTRAVENOUS
  Filled 2019-08-25: qty 1

## 2019-08-25 MED ORDER — SODIUM CHLORIDE 0.9 % IV BOLUS
1000.0000 mL | Freq: Once | INTRAVENOUS | Status: AC
  Administered 2019-08-25: 1000 mL via INTRAVENOUS

## 2019-08-25 NOTE — Discharge Instructions (Addendum)
Follow-up with your doctor for your headaches.

## 2019-08-25 NOTE — ED Notes (Signed)
ED Provider at bedside. 

## 2019-08-25 NOTE — ED Triage Notes (Signed)
Pt states that she has been suffering from a migraine X3 days. Pt was seen at PCP who did blood work and concerned that pt was dehydrated and wanted her to come to ED for follow up.

## 2019-08-25 NOTE — ED Notes (Signed)
Returned to room from CT. 

## 2019-08-25 NOTE — ED Provider Notes (Signed)
[facility] EMERGENCY DEPARTMENT Provider Note   CSN: [id number] Arrival date & time: 08/25/19  1538     History Chief Complaint  Patient presents with  . Migraine    Mariah Harris is a 44 y.o. female.  HPI Patient presents after being sent from PCP. Has had a migraine for 4 days now. States severe frontal headache. States like previous headaches but more severe. Has not seen a neurologist before. Normally gets some medicine from her doctor. States usually that treats it. States she is had some vomiting. States that light bothers her. No fevers or chills. States she was in the shower the other day and her back began to hurt so she is been sitting in bed and the headache is been bothering more. Has chronic low back pain and that is the pain she is having also. States that PCP did blood work and was told to come immediately to the ER. Although blood work had not been resulted.    Past Medical History:  Diagnosis Date  . Back pain   . Bipolar 1 disorder (HCC)   . Migraine   . Personality disorder (HCC)   . Sciatica     There are no problems to display for this patient.   Past Surgical History:  Procedure Laterality Date  . ABDOMINAL HYSTERECTOMY    . BACK SURGERY    . POSTERIOR LAMINECTOMY / DECOMPRESSION LUMBAR SPINE Bilateral 03/09/2014  . TUBAL LIGATION       OB History    Gravida  4   Para  4   Term      Preterm      AB      Living        SAB      TAB      Ectopic      Multiple      Live Births              No family history on file.  Social History   Tobacco Use  . Smoking status: Current Some Day Smoker  . Smokeless tobacco: Never Used  Vaping Use  . Vaping Use: Never used  Substance Use Topics  . Alcohol use: No  . Drug use: Not Currently    Types: Marijuana    Home Medications Prior to Admission medications   Medication Sig Start Date End Date Taking? Authorizing Provider  budesonide-formoterol (SYMBICORT) 80-4.5  MCG/ACT inhaler Inhale into the lungs.   Yes [provider]  DULoxetine (CYMBALTA) 60 MG capsule Take by mouth.   Yes [provider]  gabapentin (NEURONTIN) 300 MG capsule Take 1 capsule (300 mg total) by mouth 3 (three) times daily. 10/25/15  Yes Joy, Shawn C, PA-C  lidocaine (LIDODERM) 5 % Place 1 patch onto the skin daily. Remove & Discard patch within 12 hours or as directed by MD 10/25/15  Yes Joy, Shawn C, PA-C  meloxicam (MOBIC) 15 MG tablet Take by mouth.   Yes [provider]  methocarbamol (ROBAXIN) 500 MG tablet Take 2 tablets (1,000 mg total) by mouth every 6 (six) hours as needed for muscle spasms. 11/15/15  Yes Molpus, John, MD  ondansetron (ZOFRAN ODT) 8 MG disintegrating tablet Take 1 tablet (8 mg total) by mouth every 8 (eight) hours as needed for nausea or vomiting. 11/15/15  Yes Molpus, John, MD  tiZANidine (ZANAFLEX) 4 MG capsule Take by mouth.   Yes [provider]  buPROPion (WELLBUTRIN SR) 150 MG 12 hr tablet Take  by mouth.    [provider]  clindamycin (CLEOCIN) 300 MG capsule Take 1 capsule (300 mg total) by mouth 3 (three) times daily. 07/03/19   Gwyneth Sprout, MD  hydrOXYzine Pamoate POWD 50 mg by Misc.(Non-Drug; Combo Route) route nightly.    [provider]  oxyCODONE-acetaminophen (PERCOCET) 5-325 MG tablet Take 1-2 tablets by mouth every 6 (six) hours as needed for severe pain (for pain). 07/03/19   Gwyneth Sprout, MD  sulindac (CLINORIL) 200 MG tablet Take by mouth.    [provider]    Allergies    Asa [aspirin]  Review of Systems   Review of Systems  Constitutional: Positive for appetite change. Negative for fever.  HENT: Negative for congestion.   Respiratory: Negative for cough.   Cardiovascular: Negative for chest pain.  Gastrointestinal: Positive for nausea and vomiting.  Genitourinary: Negative for flank pain.  Musculoskeletal: Positive for back pain.  Skin: Negative for rash.    Neurological: Positive for headaches.  Psychiatric/Behavioral: Negative for confusion.    Physical Exam Updated Vital Signs BP 109/65 (BP Location: Right Arm)   Pulse 63   Temp 98.3 F (36.8 C) (Oral)   Resp 16   Ht 5\' 2"  (1.575 m)   Wt 56.2 kg   LMP 10/12/2015   SpO2 100%   BMI 22.68 kg/m   Physical Exam Vitals and nursing note reviewed.  HENT:     Head: Atraumatic.  Eyes:     Comments: Patient is wearing sunglasses.  Cardiovascular:     Rate and Rhythm: Regular rhythm.  Pulmonary:     Breath sounds: No wheezing or rhonchi.  Abdominal:     General: There is no distension.  Musculoskeletal:        General: No tenderness.  Skin:    General: Skin is warm.     Capillary Refill: Capillary refill takes less than 2 seconds.  Neurological:     Mental Status: She is alert and oriented to person, place, and time.     ED Results / Procedures / Treatments   Labs (all labs ordered are listed, but only abnormal results are displayed) Labs Reviewed  BASIC METABOLIC PANEL - Abnormal; Notable for the following components:      Result Value   Calcium 8.8 (*)    All other components within normal limits  CBC    EKG None  Radiology CT Head Wo Contrast  Result Date: 08/25/2019 CLINICAL DATA:  Headache, migraine for 3 days EXAM: CT HEAD WITHOUT CONTRAST TECHNIQUE: Contiguous axial images were obtained from the base of the skull through the vertex without intravenous contrast. COMPARISON:  None. FINDINGS: Brain: No evidence of acute infarction, hemorrhage, hydrocephalus, extra-axial collection or mass lesion/mass effect. Midline intracranial structures are normal. Cerebellar tonsils are normally positioned. Vascular: No hyperdense vessel or unexpected calcification. Skull: No calvarial fracture or suspicious osseous lesion. No scalp swelling or hematoma. Sinuses/Orbits: Paranasal sinuses and mastoid air cells are predominantly clear. Included orbital structures are unremarkable.  Other: None IMPRESSION: No acute intracranial findings. Electronically Signed   By: 08/27/2019 M.D.   On: 08/25/2019 18:44    Procedures Procedures (including critical care time)  Medications Ordered in ED Medications  sodium chloride 0.9 % bolus 1,000 mL (0 mLs Intravenous Stopped 08/25/19 1731)  promethazine (PHENERGAN) injection 25 mg (25 mg Intravenous Given 08/25/19 1621)  ketorolac (TORADOL) 30 MG/ML injection 30 mg (30 mg Intravenous Given 08/25/19 1620)    ED Course  I have reviewed the triage  vital signs and the nursing notes.  Pertinent labs & imaging results that were available during my care of the patient were reviewed by me and considered in my medical decision making (see chart for details).    MDM Rules/Calculators/A&P                          Patient with headache.  History of same.  States she was told she has migraines.  Gets medicines through her PCP.  States that has been worse last few days however.  No trauma.  Reportedly told she was dehydrated.  Lab work reassuring.  Patient feels somewhat better after treatment now.  Patient has not had previous imaging of her head to evaluate her headaches.  CT scan done and reassuring.  I think stable for discharge home with outpatient follow-up. Final Clinical Impression(s) / ED Diagnoses Final diagnoses:  Migraine with status migrainosus, not intractable, unspecified migraine type    Rx / DC Orders ED Discharge Orders    None       Davonna Belling, MD 08/25/19 [phone]

## 2019-11-22 ENCOUNTER — Encounter (HOSPITAL_BASED_OUTPATIENT_CLINIC_OR_DEPARTMENT_OTHER): Payer: Self-pay | Admitting: Emergency Medicine

## 2019-11-22 ENCOUNTER — Emergency Department (HOSPITAL_BASED_OUTPATIENT_CLINIC_OR_DEPARTMENT_OTHER)
Admission: EM | Admit: 2019-11-22 | Discharge: 2019-11-23 | Disposition: A | Discharge status: Home / Self Care | Payer: Medicaid Other | Attending: Emergency Medicine | Admitting: Emergency Medicine

## 2019-11-22 ENCOUNTER — Other Ambulatory Visit: Payer: Self-pay

## 2019-11-22 DIAGNOSIS — M7918 Myalgia, other site: Secondary | ICD-10-CM | POA: Insufficient documentation

## 2019-11-22 DIAGNOSIS — F17200 Nicotine dependence, unspecified, uncomplicated: Secondary | ICD-10-CM | POA: Diagnosis not present

## 2019-11-22 DIAGNOSIS — Z20822 Contact with and (suspected) exposure to covid-19: Secondary | ICD-10-CM | POA: Diagnosis not present

## 2019-11-22 DIAGNOSIS — B349 Viral infection, unspecified: Secondary | ICD-10-CM | POA: Insufficient documentation

## 2019-11-22 DIAGNOSIS — Z79899 Other long term (current) drug therapy: Secondary | ICD-10-CM | POA: Insufficient documentation

## 2019-11-22 DIAGNOSIS — G43809 Other migraine, not intractable, without status migrainosus: Secondary | ICD-10-CM | POA: Insufficient documentation

## 2019-11-22 DIAGNOSIS — R000 Tachycardia, unspecified: Secondary | ICD-10-CM | POA: Insufficient documentation

## 2019-11-22 DIAGNOSIS — R519 Headache, unspecified: Secondary | ICD-10-CM | POA: Diagnosis present

## 2019-11-22 LAB — SARS CORONAVIRUS 2 BY RT PCR (HOSPITAL ORDER, PERFORMED IN ~~LOC~~ HOSPITAL LAB): SARS Coronavirus 2: NEGATIVE

## 2019-11-22 MED ORDER — PROMETHAZINE HCL 25 MG/ML IJ SOLN
INTRAMUSCULAR | Status: AC
  Administered 2019-11-22: 25 mg via INTRAVENOUS
  Filled 2019-11-22: qty 1

## 2019-11-22 MED ORDER — PROMETHAZINE HCL 25 MG/ML IJ SOLN: 25.0000 mg | Freq: Once | INTRAMUSCULAR | Status: AC

## 2019-11-22 MED ORDER — DIPHENHYDRAMINE HCL 50 MG/ML IJ SOLN: 12.5000 mg | Freq: Once | INTRAMUSCULAR | Status: DC

## 2019-11-22 MED ORDER — SODIUM CHLORIDE 0.9 % IV BOLUS
1000.0000 mL | Freq: Once | INTRAVENOUS | Status: AC
  Administered 2019-11-22: 1000 mL via INTRAVENOUS

## 2019-11-22 NOTE — ED Triage Notes (Signed)
Reports being around grandson who has covid and other coworkers who tested positive.  C/o headache, n/v, body aches, and fever since Friday.  Took tylenol pta.  Afebrile in triage.

## 2019-11-23 MED ORDER — PROMETHAZINE HCL 25 MG PO TABS
25.0000 mg | ORAL_TABLET | Freq: Four times a day (QID) | ORAL | 0 refills | Status: DC | PRN
Start: 2019-11-23 — End: 2021-12-06

## 2019-11-23 NOTE — ED Provider Notes (Signed)
[facility] EMERGENCY DEPARTMENT Provider Note   CSN: [id number] Arrival date & time: 11/22/19  1944     History Chief Complaint  Patient presents with  . Headache  . Covid Exposure    Mariah Harris is a 44 y.o. female.  HPI     This is a 44 year old female with a history of bipolar disorder who presents with headache, myalgias, nausea, vomiting, and chills. Patient reports that she has had several positive Covid contacts. She is fully vaccinated. She reports that she has had symptoms since Friday. She has had multiple episodes of nonbilious, nonbloody emesis. Body aches. No documented fevers but subjective chills. Also reports a posterior headache. She has a history of migraines reports this is similar. She reports photophobia. No weakness, numbness, tingling, strokelike symptoms.  Past Medical History:  Diagnosis Date  . Back pain   . Bipolar 1 disorder (HCC)   . Migraine   . Personality disorder (HCC)   . Sciatica     There are no problems to display for this patient.   Past Surgical History:  Procedure Laterality Date  . ABDOMINAL HYSTERECTOMY    . BACK SURGERY    . POSTERIOR LAMINECTOMY / DECOMPRESSION LUMBAR SPINE Bilateral 03/09/2014  . TUBAL LIGATION       OB History    Gravida  4   Para  4   Term      Preterm      AB      Living        SAB      TAB      Ectopic      Multiple      Live Births              No family history on file.  Social History   Tobacco Use  . Smoking status: Current Some Day Smoker  . Smokeless tobacco: Never Used  Vaping Use  . Vaping Use: Never used  Substance Use Topics  . Alcohol use: No  . Drug use: Not Currently    Types: Marijuana    Home Medications Prior to Admission medications   Medication Sig Start Date End Date Taking? Authorizing Provider  budesonide-formoterol (SYMBICORT) 80-4.5 MCG/ACT inhaler Inhale into the lungs.    [provider]  buPROPion (WELLBUTRIN SR)  150 MG 12 hr tablet Take by mouth.    [provider]  clindamycin (CLEOCIN) 300 MG capsule Take 1 capsule (300 mg total) by mouth 3 (three) times daily. 07/03/19   Gwyneth Sprout, MD  DULoxetine (CYMBALTA) 60 MG capsule Take by mouth.    [provider]  gabapentin (NEURONTIN) 300 MG capsule Take 1 capsule (300 mg total) by mouth 3 (three) times daily. 10/25/15   Joy, Shawn C, PA-C  hydrOXYzine Pamoate POWD 50 mg by Misc.(Non-Drug; Combo Route) route nightly.    [provider]  lidocaine (LIDODERM) 5 % Place 1 patch onto the skin daily. Remove & Discard patch within 12 hours or as directed by MD 10/25/15   Harolyn Rutherford C, PA-C  meloxicam (MOBIC) 15 MG tablet Take by mouth.    [provider]  methocarbamol (ROBAXIN) 500 MG tablet Take 2 tablets (1,000 mg total) by mouth every 6 (six) hours as needed for muscle spasms. 11/15/15   Molpus, John, MD  ondansetron (ZOFRAN ODT) 8 MG disintegrating tablet Take 1 tablet (8 mg total) by mouth every 8 (eight) hours as needed for nausea or vomiting. 11/15/15   Molpus, Jonny Ruiz,  MD  oxyCODONE-acetaminophen (PERCOCET) 5-325 MG tablet Take 1-2 tablets by mouth every 6 (six) hours as needed for severe pain (for pain). 07/03/19   Gwyneth Sprout, MD  promethazine (PHENERGAN) 25 MG tablet Take 1 tablet (25 mg total) by mouth every 6 (six) hours as needed for nausea or vomiting. 11/23/19   Antonio Creswell, Mayer Masker, MD  sulindac (CLINORIL) 200 MG tablet Take by mouth.    [provider]  tiZANidine (ZANAFLEX) 4 MG capsule Take by mouth.    [provider]    Allergies    Asa [aspirin]  Review of Systems   Review of Systems  Constitutional: Positive for chills. Negative for fever.  Respiratory: Negative for shortness of breath.   Cardiovascular: Negative for chest pain.  Gastrointestinal: Positive for nausea and vomiting. Negative for abdominal pain, constipation and diarrhea.  Musculoskeletal: Positive for myalgias.    Neurological: Positive for headaches.  All other systems reviewed and are negative.   Physical Exam Updated Vital Signs BP 121/77 (BP Location: Right Arm)   Pulse (!) 101   Temp 98 F (36.7 C) (Oral)   Resp 20   Ht 1.575 m (5\' 2" )   Wt 65.8 kg   LMP 10/12/2015   SpO2 100%   BMI 26.52 kg/m   Physical Exam Vitals and nursing note reviewed.  Constitutional:      Appearance: She is well-developed. She is not ill-appearing.  HENT:     Head: Normocephalic and atraumatic.     Nose: No congestion.     Mouth/Throat:     Comments: Mucous membranes dry Eyes:     Pupils: Pupils are equal, round, and reactive to light.  Neck:     Comments: No meningismus Cardiovascular:     Rate and Rhythm: Regular rhythm. Tachycardia present.     Heart sounds: Normal heart sounds.  Pulmonary:     Effort: Pulmonary effort is normal. No respiratory distress.     Breath sounds: No wheezing.  Abdominal:     General: Bowel sounds are normal.     Palpations: Abdomen is soft.     Tenderness: There is no abdominal tenderness. There is no guarding or rebound.  Musculoskeletal:     Cervical back: Normal range of motion and neck supple.     Right lower leg: No edema.     Left lower leg: No edema.  Skin:    General: Skin is warm and dry.  Neurological:     Mental Status: She is alert and oriented to person, place, and time.     Comments: Cranial nerves II through XII intact, fluent speech, 5 out of 5 strength in all 4 extremities, no dysmetria to finger-nose-finger  Psychiatric:        Mood and Affect: Mood normal.     ED Results / Procedures / Treatments   Labs (all labs ordered are listed, but only abnormal results are displayed) Labs Reviewed  SARS CORONAVIRUS 2 BY RT PCR (HOSPITAL ORDER, PERFORMED IN [facility])    EKG None  Radiology No results found.  Procedures Procedures (including critical care time)  Medications Ordered in ED Medications  sodium chloride 0.9  % bolus 1,000 mL (1,000 mLs Intravenous New Bag/Given 11/22/19 2353)  promethazine (PHENERGAN) injection 25 mg (25 mg Intravenous Given 11/22/19 2354)    ED Course  I have reviewed the triage vital signs and the nursing notes.  Pertinent labs & imaging results that were available during my care of the patient  were reviewed by me and considered in my medical decision making (see chart for details).    MDM Rules/Calculators/A&P                          Patient presents with viral symptoms. Onset on Friday. Positive sick contacts. She is afebrile and vital signs notable for mild tachycardia at 101. Her physical exam is largely benign. She is not hypoxic and no significant abdominal tenderness. Neurologic exam is normal. Suspect viral etiology. Given Covid contacts, Covid testing was sent and was negative. However, given known positive contacts, would recommend quarantine and presumptive positive. Patient was given fluids and Phenergan for her symptoms. On recheck, she states she feels much better and is able to tolerate fluids without difficulty. Will recommend supportive measures at home and continued quarantine.  After history, exam, and medical workup I feel the patient has been appropriately medically screened and is safe for discharge home. Pertinent diagnoses were discussed with the patient. Patient was given return precautions.  Mariah Harris was evaluated in Emergency Department on 11/23/2019 for the symptoms described in the history of present illness. She was evaluated in the context of the global COVID-19 pandemic, which necessitated consideration that the patient might be at risk for infection with the SARS-CoV-2 virus that causes COVID-19. Institutional protocols and algorithms that pertain to the evaluation of patients at risk for COVID-19 are in a state of rapid change based on information released by regulatory bodies including the CDC and federal and state organizations. These policies  and algorithms were followed during the patient's care in the ED.   Final Clinical Impression(s) / ED Diagnoses Final diagnoses:  Viral syndrome  Other migraine without status migrainosus, not intractable    Rx / DC Orders ED Discharge Orders         Ordered    promethazine (PHENERGAN) 25 MG tablet  Every 6 hours PRN        11/23/19 0122           Shon Baton, MD 11/23/19 0126

## 2019-11-23 NOTE — Discharge Instructions (Addendum)
You were seen today for viral symptoms and headache. Your COVID-19 test is negative. However, given your known sick contacts, you need to quarantine for the next 10 to 14 days. Take Phenergan for your symptoms. If you develop new or worsening symptoms you should be reevaluated.

## 2020-07-08 ENCOUNTER — Emergency Department (HOSPITAL_BASED_OUTPATIENT_CLINIC_OR_DEPARTMENT_OTHER): Payer: BC Managed Care – PPO

## 2020-07-08 ENCOUNTER — Encounter (HOSPITAL_BASED_OUTPATIENT_CLINIC_OR_DEPARTMENT_OTHER): Payer: Self-pay | Admitting: Emergency Medicine

## 2020-07-08 ENCOUNTER — Other Ambulatory Visit: Payer: Self-pay

## 2020-07-08 ENCOUNTER — Emergency Department (HOSPITAL_BASED_OUTPATIENT_CLINIC_OR_DEPARTMENT_OTHER)
Admission: EM | Admit: 2020-07-08 | Discharge: 2020-07-08 | Disposition: A | Discharge status: Home / Self Care | Payer: BC Managed Care – PPO | Attending: Emergency Medicine | Admitting: Emergency Medicine

## 2020-07-08 DIAGNOSIS — E871 Hypo-osmolality and hyponatremia: Secondary | ICD-10-CM | POA: Insufficient documentation

## 2020-07-08 DIAGNOSIS — G8929 Other chronic pain: Secondary | ICD-10-CM | POA: Insufficient documentation

## 2020-07-08 DIAGNOSIS — M25562 Pain in left knee: Secondary | ICD-10-CM | POA: Insufficient documentation

## 2020-07-08 DIAGNOSIS — D72829 Elevated white blood cell count, unspecified: Secondary | ICD-10-CM | POA: Insufficient documentation

## 2020-07-08 DIAGNOSIS — F17200 Nicotine dependence, unspecified, uncomplicated: Secondary | ICD-10-CM | POA: Diagnosis not present

## 2020-07-08 DIAGNOSIS — M549 Dorsalgia, unspecified: Secondary | ICD-10-CM | POA: Insufficient documentation

## 2020-07-08 DIAGNOSIS — M542 Cervicalgia: Secondary | ICD-10-CM | POA: Insufficient documentation

## 2020-07-08 DIAGNOSIS — M79605 Pain in left leg: Secondary | ICD-10-CM

## 2020-07-08 LAB — CBC WITH DIFFERENTIAL/PLATELET
Abs Immature Granulocytes: 0.04 10*3/uL (ref 0.00–0.07)
Basophils Absolute: 0.1 10*3/uL (ref 0.0–0.1)
Basophils Relative: 1 %
Eosinophils Absolute: 0.4 10*3/uL (ref 0.0–0.5)
Eosinophils Relative: 4 %
HCT: 42.1 % (ref 36.0–46.0)
Hemoglobin: 14 g/dL (ref 12.0–15.0)
Immature Granulocytes: 0 %
Lymphocytes Relative: 37 %
Lymphs Abs: 3.9 10*3/uL (ref 0.7–4.0)
MCH: 29.1 pg (ref 26.0–34.0)
MCHC: 33.3 g/dL (ref 30.0–36.0)
MCV: 87.5 fL (ref 80.0–100.0)
Monocytes Absolute: 0.6 10*3/uL (ref 0.1–1.0)
Monocytes Relative: 6 %
Neutro Abs: 5.6 10*3/uL (ref 1.7–7.7)
Neutrophils Relative %: 52 %
Platelets: 388 10*3/uL (ref 150–400)
RBC: 4.81 MIL/uL (ref 3.87–5.11)
RDW: 14.6 % (ref 11.5–15.5)
WBC: 10.6 10*3/uL — ABNORMAL HIGH (ref 4.0–10.5)
nRBC: 0 % (ref 0.0–0.2)

## 2020-07-08 LAB — BASIC METABOLIC PANEL
Anion gap: 11 (ref 5–15)
BUN: 7 mg/dL (ref 6–20)
CO2: 23 mmol/L (ref 22–32)
Calcium: 8.8 mg/dL — ABNORMAL LOW (ref 8.9–10.3)
Chloride: 99 mmol/L (ref 98–111)
Creatinine, Ser: 0.84 mg/dL (ref 0.44–1.00)
GFR, Estimated: >60 mL/min (ref >60)
Glucose, Bld: 102 mg/dL — ABNORMAL HIGH (ref 70–99)
Potassium: 3.8 mmol/L (ref 3.5–5.1)
Sodium: 133 mmol/L — ABNORMAL LOW (ref 135–145)

## 2020-07-08 MED ORDER — METHYLPREDNISOLONE 4 MG PO TBPK
ORAL_TABLET | ORAL | 0 refills | Status: DC
Start: 2020-07-08 — End: 2021-12-06

## 2020-07-08 MED ORDER — OXYCODONE-ACETAMINOPHEN 5-325 MG PO TABS
2.0000 | ORAL_TABLET | Freq: Once | ORAL | Status: AC
  Administered 2020-07-08: 2 via ORAL
  Filled 2020-07-08: qty 2

## 2020-07-08 MED ORDER — PREDNISONE 50 MG PO TABS
60.0000 mg | ORAL_TABLET | Freq: Once | ORAL | Status: AC
  Administered 2020-07-08: 60 mg via ORAL
  Filled 2020-07-08: qty 1

## 2020-07-08 MED ORDER — METHOCARBAMOL 500 MG PO TABS
500.0000 mg | ORAL_TABLET | Freq: Once | ORAL | Status: AC
  Administered 2020-07-08: 500 mg via ORAL
  Filled 2020-07-08: qty 1

## 2020-07-08 MED ORDER — METHOCARBAMOL 500 MG PO TABS: 500.0000 mg | ORAL_TABLET | Freq: Two times a day (BID) | ORAL | 0 refills | Status: AC

## 2020-07-08 NOTE — ED Provider Notes (Signed)
[facility] EMERGENCY DEPARTMENT Provider Note   CSN: [id number] Arrival date & time: 07/08/20  1459     History Chief Complaint  Patient presents with  . Leg Pain    Mariah Harris is a 45 y.o. female.  Mariah Harris is a 45 y.o. female with hx of chronic back pain, sciatica, migraines, who presents to the ED for evaluation of left leg pain and swelling.  Symptoms have been present for 3 days and worsening.  Patient reports pain first started in the back of her knee but now is starting to extend down into her calf and up into the back of her thigh a bit.  Pain gets worse with weightbearing and certain movements.  Reports she did notice some slight swelling.  Denies any redness, warmth or skin changes.  Reports she has a history of chronic back and neck pain and was recently taken off work for this.  Has recently started seeing a pain management doctor and has been started on Percocet 10 mg 4 times daily.  Reports that due to her worsening neck and back pain recently she has been much more sedentary, spending most of her day in the bed or the recliner.  No hospitalizations or recent surgeries, no recent long distance travel.  No history of blood clots.  Saw her PCP today and was sent to the ED for DVT study.  No associated chest pain or shortness of breath.  Has not taken any of her pain medication today prior to arrival.        Past Medical History:  Diagnosis Date  . Back pain   . Bipolar 1 disorder (HCC)   . Migraine   . Personality disorder (HCC)   . Sciatica     There are no problems to display for this patient.   Past Surgical History:  Procedure Laterality Date  . ABDOMINAL HYSTERECTOMY    . BACK SURGERY    . POSTERIOR LAMINECTOMY / DECOMPRESSION LUMBAR SPINE Bilateral 03/09/2014  . TUBAL LIGATION       OB History    Gravida  4   Para  4   Term      Preterm      AB      Living        SAB      IAB      Ectopic      Multiple      Live  Births              History reviewed. No pertinent family history.  Social History   Tobacco Use  . Smoking status: Current Some Day Smoker  . Smokeless tobacco: Never Used  Vaping Use  . Vaping Use: Never used  Substance Use Topics  . Alcohol use: No  . Drug use: Not Currently    Types: Marijuana    Home Medications Prior to Admission medications   Medication Sig Start Date End Date Taking? Authorizing Provider  budesonide-formoterol (SYMBICORT) 80-4.5 MCG/ACT inhaler Inhale into the lungs.    [provider]  buPROPion (WELLBUTRIN SR) 150 MG 12 hr tablet Take by mouth.    [provider]  clindamycin (CLEOCIN) 300 MG capsule Take 1 capsule (300 mg total) by mouth 3 (three) times daily. 07/03/19   Gwyneth Sprout, MD  DULoxetine (CYMBALTA) 60 MG capsule Take by mouth.    [provider]  gabapentin (NEURONTIN) 300 MG capsule Take 1 capsule (300 mg total) by mouth 3 (three) times  daily. 10/25/15   Joy, Shawn C, PA-C  hydrOXYzine Pamoate POWD 50 mg by Misc.(Non-Drug; Combo Route) route nightly.    [provider]  lidocaine (LIDODERM) 5 % Place 1 patch onto the skin daily. Remove & Discard patch within 12 hours or as directed by MD 10/25/15   Harolyn Rutherford C, PA-C  meloxicam (MOBIC) 15 MG tablet Take by mouth.    [provider]  methocarbamol (ROBAXIN) 500 MG tablet Take 2 tablets (1,000 mg total) by mouth every 6 (six) hours as needed for muscle spasms. 11/15/15   Molpus, Jonny Ruiz, MD  ondansetron (ZOFRAN ODT) 8 MG disintegrating tablet Take 1 tablet (8 mg total) by mouth every 8 (eight) hours as needed for nausea or vomiting. 11/15/15   Molpus, Jonny Ruiz, MD  oxyCODONE-acetaminophen (PERCOCET) 5-325 MG tablet Take 1-2 tablets by mouth every 6 (six) hours as needed for severe pain (for pain). 07/03/19   Gwyneth Sprout, MD  promethazine (PHENERGAN) 25 MG tablet Take 1 tablet (25 mg total) by mouth every 6 (six) hours as needed for nausea or vomiting.  11/23/19   Horton, Mayer Masker, MD  sulindac (CLINORIL) 200 MG tablet Take by mouth.    [provider]  tiZANidine (ZANAFLEX) 4 MG capsule Take by mouth.    [provider]    Allergies    Asa [aspirin]  Review of Systems   Review of Systems  Constitutional: Negative for chills and fever.  Respiratory: Negative for shortness of breath.   Cardiovascular: Positive for leg swelling. Negative for chest pain and palpitations.  Gastrointestinal: Negative for nausea and vomiting.  Musculoskeletal: Positive for arthralgias, back pain (Chronic) and myalgias. Negative for joint swelling.  Skin: Negative for color change and rash.  Neurological: Negative for weakness and numbness.  All other systems reviewed and are negative.   Physical Exam Updated Vital Signs BP (!) 118/92 (BP Location: Left Arm)   Pulse 99   Temp 98.8 F (37.1 C) (Oral)   Resp 20   Ht 5\' 2"  (1.575 m)   Wt 85.7 kg   LMP 10/12/2015   SpO2 100%   BMI 34.57 kg/m   Physical Exam Vitals and nursing note reviewed.  Constitutional:      General: She is not in acute distress.    Appearance: She is well-developed. She is not diaphoretic.  HENT:     Head: Normocephalic and atraumatic.  Eyes:     General:        Right eye: No discharge.        Left eye: No discharge.     Pupils: Pupils are equal, round, and reactive to light.  Cardiovascular:     Rate and Rhythm: Normal rate and regular rhythm.     Pulses: Normal pulses.          Radial pulses are 2+ on the right side and 2+ on the left side.       Dorsalis pedis pulses are 2+ on the right side and 2+ on the left side.     Heart sounds: Normal heart sounds.  Pulmonary:     Effort: Pulmonary effort is normal. No respiratory distress.     Breath sounds: Normal breath sounds. No wheezing or rales.  Abdominal:     General: There is no distension.  Musculoskeletal:        General: No deformity.     Cervical back: Neck supple.     Comments:  Tenderness over left posterior distal thigh, popliteal fossa and  upper calf with slight swelling noted, no erythema or warmth, no significant tenderness over the anterior knee or palpable deformity, pain with range of motion but patient is able to range the knee and bear weight.  No pain in the ankle.  Distal pulses 2+, normal sensation and strength.  Skin:    General: Skin is warm and dry.     Capillary Refill: Capillary refill takes less than 2 seconds.  Neurological:     Mental Status: She is alert and oriented to person, place, and time.     Coordination: Coordination normal.     Comments: Speech is clear, able to follow commands Moves extremities without ataxia, coordination intact  Psychiatric:        Mood and Affect: Mood normal.        Behavior: Behavior normal.     ED Results / Procedures / Treatments   Labs (all labs ordered are listed, but only abnormal results are displayed) Labs Reviewed  BASIC METABOLIC PANEL - Abnormal; Notable for the following components:      Result Value   Sodium 133 (*)    Glucose, Bld 102 (*)    Calcium 8.8 (*)    All other components within normal limits  CBC WITH DIFFERENTIAL/PLATELET - Abnormal; Notable for the following components:   WBC 10.6 (*)    All other components within normal limits    EKG None  Radiology US Venous Img Lower  Left (DVT Study)  Result Date: 07/08/2020 CLINICAL DATA:  Left leg pain EXAM: LEFT LOWER EXTREMITY VENOUS DOPPLER ULTRASOUND TECHNIQUE: Gray-scale sonography with compression, as well as color and duplex ultrasound, were performed to evaluate the deep venous system(s) from the level of the common femoral vein through the popliteal and proximal calf veins. COMPARISON:  None. FINDINGS: VENOUS Normal compressibility of the common femoral, superficial femoral, and popliteal veins, as well as the visualized calf veins. Visualized portions of profunda femoral vein and great saphenous vein unremarkable. No filling  defects to suggest DVT on grayscale or color Doppler imaging. Doppler waveforms show normal direction of venous flow, normal respiratory plasticity and response to augmentation. Limited views of the contralateral common femoral vein are unremarkable. OTHER None. Limitations: none IMPRESSION: Negative. Electronically Signed   By: Katherine Mantlehristopher  Green M.D.   On: 07/08/2020 16:32    Procedures Procedures   Medications Ordered in ED Medications  predniSONE (DELTASONE) tablet 60 mg (has no administration in time range)  methocarbamol (ROBAXIN) tablet 500 mg (has no administration in time range)  oxyCODONE-acetaminophen (PERCOCET/ROXICET) 5-325 MG per tablet 2 tablet (2 tablets Oral Given 07/08/20 1523)    ED Course  I have reviewed the triage vital signs and the nursing notes.  Pertinent labs & imaging results that were available during my care of the patient were reviewed by me and considered in my medical decision making (see chart for details).    MDM Rules/Calculators/A&P                         45 year old female presents with left leg pain and slight swelling noted over the past 3 days.  Has chronic back pain with sciatica in the left leg but noticed this worsening pain.  Saw her PCP who sent her to the ED to rule out DVT.  No prior history of blood clots, no recent long distance travel or surgery, has recently been more sedentary due to worsening back pain.  No chest pain or shortness of  breath.  Will evaluate with basic labs and DVT study.  She is already being evaluated by chronic pain management, and they are starting her on physical therapy next week.  She has follow-up appointment with her pain doctor on Monday.  We will give her dose of her home pain medication here as she is not taking any of this today.  I have independently ordered, reviewed and interpreted all labs and imaging: CBC: Minimal leukocytosis, normal hemoglobin BMP: Mild hyponatremia and hypocalcemia, no other significant  electrolyte derangements, normal renal function  DVT study: Negative  I suspect patient's pain may be coming more so from her sciatica it is reproducible with straight leg raise and palpation over the back.  In addition to her home pain medication we will treat with a short course of steroids and muscle relaxers.  Patient reports pain is worse with weightbearing on the leg and she is concerned lose her balance due to the pain.  We will give her crutches here in the ED and prescribed a walker to use as needed at home until she is able to follow-up with her pain doctor and continue with physical therapy.    Final Clinical Impression(s) / ED Diagnoses Final diagnoses:  Left leg pain    Rx / DC Orders ED Discharge Orders         Ordered    methocarbamol (ROBAXIN) 500 MG tablet  2 times daily        07/08/20 1706    methylPREDNISolone (MEDROL DOSEPAK) 4 MG TBPK tablet        07/08/20 1706    Walker rolling        07/08/20 1706           Dartha Lodge, [place] 07/08/20 1706    Sabino Donovan, MD 07/08/20 2324

## 2020-07-08 NOTE — ED Triage Notes (Signed)
Pt presents to ED POV. Pt c/o L leg pain and swelling x3d. Pt reports that she went PCP and reports that they told her to come here for ultrasound to r/o dvt. Pt reports pain is behind knee and radiates upward when weight bearing.

## 2020-07-08 NOTE — Discharge Instructions (Addendum)
Your ultrasound showed no evidence of blood clot today.  Continue using your home pain medication as well as prescribed steroids and muscle relaxer to help with pain.  I suspect this is coming from sciatica or lumbar radiculopathy from disc protrusions noted on your lumbar spine MRI.  Follow-up with your pain doctor next week.  You can use a walker or crutches to help make it a little easier for you to get around and balance.  You can take the walker prescription to a medical supply store to get this.

## 2020-11-29 ENCOUNTER — Emergency Department (HOSPITAL_BASED_OUTPATIENT_CLINIC_OR_DEPARTMENT_OTHER): Payer: BC Managed Care – PPO

## 2020-11-29 ENCOUNTER — Other Ambulatory Visit: Payer: Self-pay

## 2020-11-29 ENCOUNTER — Emergency Department (HOSPITAL_BASED_OUTPATIENT_CLINIC_OR_DEPARTMENT_OTHER)
Admission: EM | Admit: 2020-11-29 | Discharge: 2020-11-29 | Disposition: A | Discharge status: Home / Self Care | Payer: BC Managed Care – PPO | Attending: Emergency Medicine | Admitting: Emergency Medicine

## 2020-11-29 ENCOUNTER — Encounter (HOSPITAL_BASED_OUTPATIENT_CLINIC_OR_DEPARTMENT_OTHER): Payer: Self-pay | Admitting: *Deleted

## 2020-11-29 DIAGNOSIS — S8992XA Unspecified injury of left lower leg, initial encounter: Secondary | ICD-10-CM | POA: Diagnosis not present

## 2020-11-29 DIAGNOSIS — W108XXA Fall (on) (from) other stairs and steps, initial encounter: Secondary | ICD-10-CM | POA: Insufficient documentation

## 2020-11-29 DIAGNOSIS — Z87891 Personal history of nicotine dependence: Secondary | ICD-10-CM | POA: Diagnosis not present

## 2020-11-29 DIAGNOSIS — M7989 Other specified soft tissue disorders: Secondary | ICD-10-CM | POA: Insufficient documentation

## 2020-11-29 DIAGNOSIS — M25462 Effusion, left knee: Secondary | ICD-10-CM | POA: Insufficient documentation

## 2020-11-29 MED ORDER — NAPROXEN 375 MG PO TABS: 375.0000 mg | ORAL_TABLET | Freq: Two times a day (BID) | ORAL | 0 refills | Status: AC

## 2020-11-29 MED ORDER — ONDANSETRON 4 MG PO TBDP
4.0000 mg | ORAL_TABLET | Freq: Once | ORAL | Status: AC
  Administered 2020-11-29: 4 mg via ORAL
  Filled 2020-11-29: qty 1

## 2020-11-29 MED ORDER — OXYCODONE-ACETAMINOPHEN 5-325 MG PO TABS
1.0000 | ORAL_TABLET | Freq: Once | ORAL | Status: AC
  Administered 2020-11-29: 1 via ORAL
  Filled 2020-11-29: qty 1

## 2020-11-29 NOTE — Discharge Instructions (Addendum)
I have ordered an anti-inflammatory medicaiton for pain which you can add on to your daily pain medications. Follow up as soon as possible with Dr. Jordan Likes.  Get help right away if: Your foot or toes are numb, cold, or blue after loosening your splint or brace.

## 2020-11-29 NOTE — ED Provider Notes (Signed)
[facility] EMERGENCY DEPARTMENT Provider Note   CSN: [id number] Arrival date & time: 11/29/20  1657     History Chief Complaint  Patient presents with   Knee Injury    Mariah Harris is a 45 y.o. female.  The history is provided by the patient.  Knee Pain Location:  Knee Time since incident:  2 days Injury: yes   Mechanism of injury comment:  Twist Pain details:    Quality:  Pressure, throbbing and aching   Radiates to:  Does not radiate   Severity:  Severe   Onset quality:  Sudden   Duration:  2 days   Timing:  Constant   Progression:  Worsening Chronicity:  New Dislocation: no   Prior injury to area:  No Relieved by:  Nothing Worsened by:  Bearing weight, extension, flexion and activity Ineffective treatments:  Acetaminophen, NSAIDs, rest, ice and elevation Associated symptoms: decreased ROM and swelling   Associated symptoms: no back pain, no fatigue and no numbness   Risk factors: obesity      45 year old female who presents emergency department with severe left knee pain.  Patient states that she was coming down the stairs, missed a step landed on her knee which twisted.  She complains of severe pain since that time with swelling.  She has inability to bear weight on the knee.  She has been trying to walk with a cane.  She states that every time she tries to bear weight on the leg she feels like her knee is unstable and the pain is severe.  She has no previous injuries to the area.  She has tried Tylenol and Motrin without relief of her symptoms.  She has tried ice and elevation.  Her swelling has markedly increased over the past 2 days.    Past Medical History:  Diagnosis Date   Back pain    Bipolar 1 disorder (HCC)    Migraine    Personality disorder (HCC)    Sciatica     There are no problems to display for this patient.   Past Surgical History:  Procedure Laterality Date   ABDOMINAL HYSTERECTOMY     BACK SURGERY     POSTERIOR  LAMINECTOMY / DECOMPRESSION LUMBAR SPINE Bilateral 03/09/2014   TUBAL LIGATION       OB History     Gravida  4   Para  4   Term      Preterm      AB      Living         SAB      IAB      Ectopic      Multiple      Live Births              No family history on file.  Social History   Tobacco Use   Smoking status: Former    Types: Cigarettes   Smokeless tobacco: Never  Vaping Use   Vaping Use: Never used  Substance Use Topics   Alcohol use: No   Drug use: Not Currently    Types: Marijuana    Home Medications Prior to Admission medications   Medication Sig Start Date End Date Taking? Authorizing Provider  budesonide-formoterol (SYMBICORT) 80-4.5 MCG/ACT inhaler Inhale into the lungs.    [provider]  buPROPion (WELLBUTRIN SR) 150 MG 12 hr tablet Take by mouth.    [provider]  clindamycin (CLEOCIN) 300 MG capsule Take 1 capsule (300 mg  total) by mouth 3 (three) times daily. 07/03/19   Gwyneth Sprout, MD  DULoxetine (CYMBALTA) 60 MG capsule Take by mouth.    [provider]  gabapentin (NEURONTIN) 300 MG capsule Take 1 capsule (300 mg total) by mouth 3 (three) times daily. 10/25/15   Joy, Shawn C, PA-C  hydrOXYzine Pamoate POWD 50 mg by Misc.(Non-Drug; Combo Route) route nightly.    [provider]  lidocaine (LIDODERM) 5 % Place 1 patch onto the skin daily. Remove & Discard patch within 12 hours or as directed by MD 10/25/15   Harolyn Rutherford C, PA-C  meloxicam (MOBIC) 15 MG tablet Take by mouth.    [provider]  methocarbamol (ROBAXIN) 500 MG tablet Take 1 tablet (500 mg total) by mouth 2 (two) times daily. 07/08/20   Dartha Lodge, PA-C  methylPREDNISolone (MEDROL DOSEPAK) 4 MG TBPK tablet Take as directed 07/08/20   Dartha Lodge, PA-C  ondansetron (ZOFRAN ODT) 8 MG disintegrating tablet Take 1 tablet (8 mg total) by mouth every 8 (eight) hours as needed for nausea or vomiting. 11/15/15   Molpus, Jonny Ruiz, MD   oxyCODONE-acetaminophen (PERCOCET) 5-325 MG tablet Take 1-2 tablets by mouth every 6 (six) hours as needed for severe pain (for pain). 07/03/19   Gwyneth Sprout, MD  promethazine (PHENERGAN) 25 MG tablet Take 1 tablet (25 mg total) by mouth every 6 (six) hours as needed for nausea or vomiting. 11/23/19   Horton, Mayer Masker, MD  sulindac (CLINORIL) 200 MG tablet Take by mouth.    [provider]  tiZANidine (ZANAFLEX) 4 MG capsule Take by mouth.    [provider]    Allergies    Asa [aspirin]  Review of Systems   Review of Systems  Constitutional:  Negative for fatigue.  Musculoskeletal:  Positive for gait problem and joint swelling. Negative for back pain.  Neurological:  Negative for weakness and numbness.   Physical Exam Updated Vital Signs BP 105/71 (BP Location: Left Arm)   Pulse 96   Temp 98.7 F (37.1 C) (Oral)   Resp 20   Ht 5\' 2"  (1.575 m)   Wt 85.7 kg   LMP 10/12/2015   SpO2 100%   BMI 34.56 kg/m   Physical Exam Vitals and nursing note reviewed.  Constitutional:      General: She is not in acute distress.    Appearance: She is well-developed. She is not diaphoretic.  HENT:     Head: Normocephalic and atraumatic.     Right Ear: External ear normal.     Left Ear: External ear normal.     Nose: Nose normal.     Mouth/Throat:     Mouth: Mucous membranes are moist.  Eyes:     General: No scleral icterus.    Conjunctiva/sclera: Conjunctivae normal.  Cardiovascular:     Rate and Rhythm: Normal rate and regular rhythm.     Heart sounds: Normal heart sounds. No murmur heard.   No friction rub. No gallop.  Pulmonary:     Effort: Pulmonary effort is normal. No respiratory distress.     Breath sounds: Normal breath sounds.  Abdominal:     General: Bowel sounds are normal. There is no distension.     Palpations: Abdomen is soft. There is no mass.     Tenderness: There is no abdominal tenderness. There is no guarding.  Musculoskeletal:      Cervical back: Normal range of motion.     Comments: Left knee examination reveals  large amount of swelling and effusion.  Exquisitely tender to palpation along the entire joint line and particularly worse on the medial aspect.  Patient is unable to tolerate any range of motion of the knee passively.  She is unable to tolerate stress on the medial or collateral ligaments.  She does not appear to have a positive anterior posterior drawer test.  Skin:    General: Skin is warm and dry.  Neurological:     Mental Status: She is alert and oriented to person, place, and time.  Psychiatric:        Behavior: Behavior normal.    ED Results / Procedures / Treatments   Labs (all labs ordered are listed, but only abnormal results are displayed) Labs Reviewed - No data to display  EKG None  Radiology DG Knee Complete 4 Views Left  Result Date: 11/29/2020 CLINICAL DATA:  Twisting injury 2 days ago with persistent pain and limited motion, initial encounter EXAM: LEFT KNEE - COMPLETE 4+ VIEW COMPARISON:  None. FINDINGS: No acute fracture or dislocation is noted. Minimal joint effusion and suprapatellar soft tissue swelling is seen. No other focal abnormality is noted. IMPRESSION: No evidence of acute fracture or dislocation. Small joint effusion and soft tissue swelling is noted. Electronically Signed   By: Alcide Clever M.D.   On: 11/29/2020 18:08    Procedures Procedures   Medications Ordered in ED Medications  oxyCODONE-acetaminophen (PERCOCET/ROXICET) 5-325 MG per tablet 1 tablet (has no administration in time range)  ondansetron (ZOFRAN-ODT) disintegrating tablet 4 mg (has no administration in time range)    ED Course  I have reviewed the triage vital signs and the nursing notes.  Pertinent labs & imaging results that were available during my care of the patient were reviewed by me and considered in my medical decision making (see chart for details).    MDM Rules/Calculators/A&P                            46 year old female presents emergency department with chief complaint of knee injury.  On physical examination she is exquisitely tender and has moderate effusion.  I had significant difficulty assessing the knee due to the patient's level of pain.  I reviewed the PDMP and patient is on chronic opiate therapy.  After reviewing clinical images of a plain film of the knee and CT scan of the knee there is no evidence of tibial plateau fracture, lipohemarthrosis or other significant injury suggestive of fracture.  The patient was placed in a knee immobilizer and given crutches.  I have added NSAID therapy and suggested elevation ice and compression.  Patient given referral to Dr. Jordan Likes for further evaluation of her knee injury.  She appears otherwise appropriate for discharge at this time with close outpatient follow-up. Final Clinical Impression(s) / ED Diagnoses Final diagnoses:  None    Rx / DC Orders ED Discharge Orders     None        Arthor Captain, PA-C 11/30/20 0827    Charlynne Pander, MD 12/01/20 1346

## 2020-11-29 NOTE — ED Notes (Signed)
Patient transported to X-ray 

## 2020-11-29 NOTE — ED Triage Notes (Signed)
2 days ago her left knee twisted. Now she is having pain and swelling with limited motion.

## 2020-12-01 ENCOUNTER — Ambulatory Visit: Payer: BC Managed Care – PPO | Admitting: Family Medicine

## 2020-12-01 NOTE — Progress Notes (Deleted)
  Mariah Harris - 45 y.o. female MRN [id number]  Date of birth: 09/28/1975  SUBJECTIVE:  Including CC & ROS.  No chief complaint on file.   Mariah Harris is a 45 y.o. female that is  ***.  ***   Review of Systems See HPI   HISTORY: Past Medical, Surgical, Social, and Family History Reviewed & Updated per EMR.   Pertinent Historical Findings include:  Past Medical History:  Diagnosis Date   Back pain    Bipolar 1 disorder (HCC)    Migraine    Personality disorder (HCC)    Sciatica     Past Surgical History:  Procedure Laterality Date   ABDOMINAL HYSTERECTOMY     BACK SURGERY     POSTERIOR LAMINECTOMY / DECOMPRESSION LUMBAR SPINE Bilateral 03/09/2014   TUBAL LIGATION      No family history on file.  Social History   Socioeconomic History   Marital status: Single    Spouse name: Not on file   Number of children: Not on file   Years of education: Not on file   Highest education level: Not on file  Occupational History   Not on file  Tobacco Use   Smoking status: Former    Types: Cigarettes   Smokeless tobacco: Never  Vaping Use   Vaping Use: Never used  Substance and Sexual Activity   Alcohol use: No   Drug use: Not Currently    Types: Marijuana   Sexual activity: Not on file  Other Topics Concern   Not on file  Social History Narrative   Not on file   Social Determinants of Health   Financial Resource Strain: Not on file  Food Insecurity: Not on file  Transportation Needs: Not on file  Physical Activity: Not on file  Stress: Not on file  Social Connections: Not on file  Intimate Partner Violence: Not on file     PHYSICAL EXAM:  VS: LMP 10/12/2015  Physical Exam Gen: NAD, alert, cooperative with exam, well-appearing MSK:  ***      ASSESSMENT & PLAN:   No problem-specific Assessment & Plan notes found for this encounter.

## 2021-03-12 HISTORY — PX: BACK SURGERY: SHX140

## 2021-10-30 ENCOUNTER — Other Ambulatory Visit: Payer: Self-pay | Admitting: Neurological Surgery

## 2021-11-17 NOTE — Pre-Procedure Instructions (Signed)
Surgical Instructions    Your procedure is scheduled on Wednesday, September 13.  Report to [facility] Main Entrance "A" at 6:30 A.M., then check in with the Admitting office.  Call this number if you have problems the morning of surgery:  [phone]   If you have any questions prior to your surgery date call [phone]: Open Monday-Friday 8am-4pm    Remember:  Do not eat after midnight the night before your surgery  You may drink clear liquids until 5:30AM the morning of your surgery.   Clear liquids allowed are: Water, Non-Citrus Juices (without pulp), Carbonated Beverages, Clear Tea, Black Coffee ONLY (NO MILK, CREAM OR POWDERED CREAMER of any kind), and Gatorade    Take these medicines the morning of surgery with A SIP OF WATER:   DULoxetine (CYMBALTA)  Oxcarbazepine (TRILEPTAL) esomeprazole (NEXIUM) pregabalin (LYRICA) cyclobenzaprine (FLEXERIL) if needed oxyCODONE-acetaminophen (PERCOCET) if needed albuterol (VENTOLIN HFA) 108 (90 Base) MCG/ACT inhaler if needed budesonide-formoterol (SYMBICORT) if needed  Please bring all inhalers with you the day of surgery.    As of today, STOP taking any meloxicam (MOBIC), Aspirin (unless otherwise instructed by your surgeon) Aleve, Naproxen, Ibuprofen, Motrin, Advil, Goody's, BC's, all herbal medications, fish oil, and all vitamins.   [facility] is not responsible for any belongings or valuables.    Do NOT Smoke (Tobacco/Vaping)  24 hours prior to your procedure  If you use a CPAP at night, you may bring your mask for your overnight stay.   Contacts, glasses, hearing aids, dentures or partials may not be worn into surgery, please bring cases for these belongings   For patients admitted to the hospital, discharge time will be determined by your treatment team.   Patients discharged the day of surgery will not be allowed to drive home, and someone needs to stay with them for 24 hours.   SURGICAL WAITING ROOM  VISITATION Patients having surgery or a procedure may have no more than 2 support people in the waiting area - these visitors may rotate.   Children under the age of 23 must have an adult with them who is not the patient. If the patient needs to stay at the hospital during part of their recovery, the visitor guidelines for inpatient rooms apply. Pre-op nurse will coordinate an appropriate time for 1 support person to accompany patient in pre-op.  This support person may not rotate.   Please refer to the [facility] website for the visitor guidelines for Inpatients (after your surgery is over and you are in a regular room).    Special instructions:    Oral Hygiene is also important to reduce your risk of infection.  Remember - BRUSH YOUR TEETH THE MORNING OF SURGERY WITH YOUR REGULAR TOOTHPASTE   [facility]- Preparing For Surgery  Before surgery, you can play an important role. Because skin is not sterile, your skin needs to be as free of germs as possible. You can reduce the number of germs on your skin by washing with CHG (chlorahexidine gluconate) Soap before surgery.  CHG is an antiseptic cleaner which kills germs and bonds with the skin to continue killing germs even after washing.     Please do not use if you have an allergy to CHG or antibacterial soaps. If your skin becomes reddened/irritated stop using the CHG.  Do not shave (including legs and underarms) for at least 48 hours prior to first CHG shower. It is OK to shave your face.  Please follow these instructions carefully.  Shower the NIGHT BEFORE SURGERY and the MORNING OF SURGERY with CHG Soap.   If you chose to wash your hair, wash your hair first as usual with your normal shampoo. After you shampoo, rinse your hair and body thoroughly to remove the shampoo.  Then [employer] and genitals (private parts) with your normal soap and rinse thoroughly to remove soap.  After that Use CHG Soap as you would any other liquid soap.  You can apply CHG directly to the skin and wash gently with a scrungie or a clean washcloth.   Apply the CHG Soap to your body ONLY FROM THE NECK DOWN.  Do not use on open wounds or open sores. Avoid contact with your eyes, ears, mouth and genitals (private parts). Wash Face and genitals (private parts)  with your normal soap.   Wash thoroughly, paying special attention to the area where your surgery will be performed.  Thoroughly rinse your body with warm water from the neck down.  DO NOT shower/wash with your normal soap after using and rinsing off the CHG Soap.  Pat yourself dry with a CLEAN TOWEL.  Wear CLEAN PAJAMAS to bed the night before surgery  Place CLEAN SHEETS on your bed the night before your surgery  DO NOT SLEEP WITH PETS.   Day of Surgery:  Take a shower with CHG soap. Wear Clean/Comfortable clothing the morning of surgery Do not wear jewelry or makeup. Do not wear lotions, powders, perfumes/cologne or deodorant. Do not shave 48 hours prior to surgery.  Men may shave face and neck. Do not bring valuables to the hospital. Do not wear nail polish, gel polish, artificial nails, or any other type of covering on natural nails (fingers and toes) If you have artificial nails or gel coating that need to be removed by a nail salon, please have this removed prior to surgery. Artificial nails or gel coating may interfere with anesthesia's ability to adequately monitor your vital signs. Remember to brush your teeth WITH YOUR REGULAR TOOTHPASTE.    If you received a COVID test during your pre-op visit, it is requested that you wear a mask when out in public, stay away from anyone that may not be feeling well, and notify your surgeon if you develop symptoms. If you have been in contact with anyone that has tested positive in the last 10 days, please notify your surgeon.    Please read over the following fact sheets that you were given.

## 2021-11-20 ENCOUNTER — Inpatient Hospital Stay (HOSPITAL_COMMUNITY)
Admission: RE | Admit: 2021-11-20 | Discharge: 2021-11-20 | Disposition: A | Discharge status: Home / Self Care | Payer: BC Managed Care – PPO | Source: Ambulatory Visit

## 2021-11-21 ENCOUNTER — Inpatient Hospital Stay (HOSPITAL_COMMUNITY)
Admission: RE | Admit: 2021-11-21 | Discharge: 2021-11-21 | Disposition: A | Discharge status: Home / Self Care | Payer: BC Managed Care – PPO | Source: Ambulatory Visit

## 2021-11-21 NOTE — Progress Notes (Signed)
Patient did not show for scheduled PAT appt today. Called patient and she stated that she had been in a small accident this morning and was on her way to the hospital. She was reminded about her surgery scheduled with Dr. Yetta Barre for tomorrow and patient said that she may have to reschedule. Told patient to call surgeon's office to let them know. I also called Dr. Yetta Barre' office to give them a heads up.

## 2021-11-22 ENCOUNTER — Ambulatory Visit: Admit: 2021-11-22 | Payer: BC Managed Care – PPO | Admitting: Neurological Surgery

## 2021-11-22 SURGERY — LUMBAR SPINAL CORD STIMULATOR REMOVAL
Anesthesia: General | Site: Back

## 2021-12-01 ENCOUNTER — Encounter (HOSPITAL_COMMUNITY): Payer: Self-pay

## 2021-12-01 NOTE — Pre-Procedure Instructions (Signed)
Surgical Instructions    Your procedure is scheduled on Wednesday, September 27th.  Report to [facility] Main Entrance "A" at 08:50 A.M., then check in with the Admitting office.  Call this number if you have problems the morning of surgery:  [phone]   If you have any questions prior to your surgery date call [phone]: Open Monday-Friday 8am-4pm    Remember:  Do not eat after midnight the night before your surgery  You may drink clear liquids until 07:50 AM the morning of your surgery.   Clear liquids allowed are: Water, Non-Citrus Juices (without pulp), Carbonated Beverages, Clear Tea, Black Coffee Only (NO MILK, CREAM OR POWDERED CREAMER of any kind), and Gatorade.    Take these medicines the morning of surgery with A SIP OF WATER  DULoxetine (CYMBALTA)  esomeprazole (NEXIUM)  Oxcarbazepine (TRILEPTAL)  pregabalin (LYRICA)   If needed: albuterol (VENTOLIN HFA)- if needed, bring with you on day of surgery budesonide-formoterol (SYMBICORT) cyclobenzaprine (FLEXERIL)  NURTEC  oxyCODONE-acetaminophen (PERCOCET)    As of today, STOP taking any Aspirin (unless otherwise instructed by your surgeon) Aleve, Naproxen, Ibuprofen, Motrin, Advil, Goody's, BC's, all herbal medications, fish oil, and all vitamins.                     Do NOT Smoke (Tobacco/Vaping) for 24 hours prior to your procedure.  If you use a CPAP at night, you may bring your mask/headgear for your overnight stay.   Contacts, glasses, piercing's, hearing aid's, dentures or partials may not be worn into surgery, please bring cases for these belongings.    For patients admitted to the hospital, discharge time will be determined by your treatment team.   Patients discharged the day of surgery will not be allowed to drive home, and someone needs to stay with them for 24 hours.  SURGICAL WAITING ROOM VISITATION Patients having surgery or a procedure may have no more than 2 support people in the waiting area -  these visitors may rotate.   Children under the age of 20 must have an adult with them who is not the patient. If the patient needs to stay at the hospital during part of their recovery, the visitor guidelines for inpatient rooms apply. Pre-op nurse will coordinate an appropriate time for 1 support person to accompany patient in pre-op.  This support person may not rotate.   Please refer to the [facility] website for the visitor guidelines for Inpatients (after your surgery is over and you are in a regular room).    Special instructions:   [place]- Preparing For Surgery  Before surgery, you can play an important role. Because skin is not sterile, your skin needs to be as free of germs as possible. You can reduce the number of germs on your skin by washing with CHG (chlorahexidine gluconate) Soap before surgery.  CHG is an antiseptic cleaner which kills germs and bonds with the skin to continue killing germs even after washing.    Oral Hygiene is also important to reduce your risk of infection.  Remember - BRUSH YOUR TEETH THE MORNING OF SURGERY WITH YOUR REGULAR TOOTHPASTE  Please do not use if you have an allergy to CHG or antibacterial soaps. If your skin becomes reddened/irritated stop using the CHG.  Do not shave (including legs and underarms) for at least 48 hours prior to first CHG shower. It is OK to shave your face.  Please follow these instructions carefully.   Shower the [employer]  SURGERY and the MORNING OF SURGERY  If you chose to wash your hair, wash your hair first as usual with your normal shampoo.  After you shampoo, rinse your hair and body thoroughly to remove the shampoo.  Use CHG Soap as you would any other liquid soap. You can apply CHG directly to the skin and wash gently with a scrungie or a clean washcloth.   Apply the CHG Soap to your body ONLY FROM THE NECK DOWN.  Do not use on open wounds or open sores. Avoid contact with your eyes, ears, mouth and  genitals (private parts). Wash Face and genitals (private parts)  with your normal soap.   Wash thoroughly, paying special attention to the area where your surgery will be performed.  Thoroughly rinse your body with warm water from the neck down.  DO NOT shower/wash with your normal soap after using and rinsing off the CHG Soap.  Pat yourself dry with a CLEAN TOWEL.  Wear CLEAN PAJAMAS to bed the night before surgery  Place CLEAN SHEETS on your bed the night before your surgery  DO NOT SLEEP WITH PETS.   Day of Surgery: Take a shower with CHG soap. Do not wear jewelry or makeup Do not wear lotions, powders, perfumes, or deodorant. Do not shave 48 hours prior to surgery.   Do not bring valuables to the hospital. [facility] is not responsible for any belongings or valuables. Do not wear nail polish, gel polish, artificial nails, or any other type of covering on natural nails (fingers and toes) If you have artificial nails or gel coating that need to be removed by a nail salon, please have this removed prior to surgery. Artificial nails or gel coating may interfere with anesthesia's ability to adequately monitor your vital signs. Wear Clean/Comfortable clothing the morning of surgery Remember to brush your teeth WITH YOUR REGULAR TOOTHPASTE.   Please read over the following fact sheets that you were given.    If you received a COVID test during your pre-op visit  it is requested that you wear a mask when out in public, stay away from anyone that may not be feeling well and notify your surgeon if you develop symptoms. If you have been in contact with anyone that has tested positive in the last 10 days please notify you surgeon.

## 2021-12-04 ENCOUNTER — Encounter (HOSPITAL_COMMUNITY)
Admission: RE | Admit: 2021-12-04 | Discharge: 2021-12-04 | Disposition: A | Discharge status: Home / Self Care | Payer: Medicaid Other | Source: Ambulatory Visit | Attending: Neurological Surgery | Admitting: Neurological Surgery

## 2021-12-04 ENCOUNTER — Other Ambulatory Visit: Payer: Self-pay

## 2021-12-04 ENCOUNTER — Encounter (HOSPITAL_COMMUNITY): Payer: Self-pay

## 2021-12-04 VITALS — BP 101/76 | HR 97 | Temp 98.0°F | Resp 20 | Ht 62.0 in | Wt 165.8 lb

## 2021-12-04 DIAGNOSIS — Z01812 Encounter for preprocedural laboratory examination: Secondary | ICD-10-CM | POA: Insufficient documentation

## 2021-12-04 DIAGNOSIS — Z8669 Personal history of other diseases of the nervous system and sense organs: Secondary | ICD-10-CM | POA: Insufficient documentation

## 2021-12-04 DIAGNOSIS — Z01818 Encounter for other preprocedural examination: Secondary | ICD-10-CM

## 2021-12-04 HISTORY — DX: Depression, unspecified: F32.A

## 2021-12-04 HISTORY — DX: Gastro-esophageal reflux disease without esophagitis: K21.9

## 2021-12-04 HISTORY — DX: Attention-deficit hyperactivity disorder, unspecified type: F90.9

## 2021-12-04 HISTORY — DX: Anxiety disorder, unspecified: F41.9

## 2021-12-04 LAB — CBC
HCT: 45.4 % (ref 36.0–46.0)
Hemoglobin: 14.9 g/dL (ref 12.0–15.0)
MCH: 29.4 pg (ref 26.0–34.0)
MCHC: 32.8 g/dL (ref 30.0–36.0)
MCV: 89.7 fL (ref 80.0–100.0)
Platelets: 383 10*3/uL (ref 150–400)
RBC: 5.06 MIL/uL (ref 3.87–5.11)
RDW: 14.6 % (ref 11.5–15.5)
WBC: 10.5 10*3/uL (ref 4.0–10.5)
nRBC: 0 % (ref 0.0–0.2)

## 2021-12-04 LAB — BASIC METABOLIC PANEL
Anion gap: 11 (ref 5–15)
BUN: 8 mg/dL (ref 6–20)
CO2: 20 mmol/L — ABNORMAL LOW (ref 22–32)
Calcium: 9.4 mg/dL (ref 8.9–10.3)
Chloride: 107 mmol/L (ref 98–111)
Creatinine, Ser: 1.01 mg/dL — ABNORMAL HIGH (ref 0.44–1.00)
GFR, Estimated: >60 mL/min (ref >60)
Glucose, Bld: 92 mg/dL (ref 70–99)
Potassium: 4.5 mmol/L (ref 3.5–5.1)
Sodium: 138 mmol/L (ref 135–145)

## 2021-12-04 LAB — SURGICAL PCR SCREEN
MRSA, PCR: NEGATIVE
Staphylococcus aureus: NEGATIVE

## 2021-12-04 NOTE — Progress Notes (Signed)
PCP - Edwinna Areola Cardiologist - denies  PPM/ICD - denies   Chest x-ray - 07/03/19 EKG - 08/28/17 Stress Test - denies ECHO - denies Cardiac Cath - denies  Sleep Study - denies   DM- denies  ASA/Blood Thinner Instructions: n/a   ERAS Protcol - yes, no drink   COVID TEST- n/a   Anesthesia review: no  Patient denies shortness of breath, fever, cough and chest pain at PAT appointment   All instructions explained to the patient, with a verbal understanding of the material. Patient agrees to go over the instructions while at home for a better understanding. The opportunity to ask questions was provided.

## 2021-12-06 ENCOUNTER — Encounter (HOSPITAL_COMMUNITY): Payer: Self-pay | Admitting: Neurological Surgery

## 2021-12-06 ENCOUNTER — Ambulatory Visit (HOSPITAL_BASED_OUTPATIENT_CLINIC_OR_DEPARTMENT_OTHER): Payer: Medicaid Other | Admitting: Certified Registered"

## 2021-12-06 ENCOUNTER — Ambulatory Visit (HOSPITAL_COMMUNITY)
Admission: RE | Admit: 2021-12-06 | Discharge: 2021-12-06 | Disposition: A | Discharge status: Home / Self Care | Payer: Medicaid Other | Source: Ambulatory Visit | Attending: Neurological Surgery | Admitting: Neurological Surgery

## 2021-12-06 ENCOUNTER — Other Ambulatory Visit: Payer: Self-pay

## 2021-12-06 ENCOUNTER — Ambulatory Visit (HOSPITAL_COMMUNITY): Payer: Medicaid Other | Admitting: Certified Registered"

## 2021-12-06 ENCOUNTER — Encounter (HOSPITAL_COMMUNITY)
Admission: RE | Disposition: A | Discharge status: Home / Self Care | Payer: Self-pay | Source: Ambulatory Visit | Attending: Neurological Surgery

## 2021-12-06 DIAGNOSIS — F418 Other specified anxiety disorders: Secondary | ICD-10-CM | POA: Diagnosis not present

## 2021-12-06 DIAGNOSIS — K219 Gastro-esophageal reflux disease without esophagitis: Secondary | ICD-10-CM | POA: Diagnosis not present

## 2021-12-06 DIAGNOSIS — F319 Bipolar disorder, unspecified: Secondary | ICD-10-CM | POA: Insufficient documentation

## 2021-12-06 DIAGNOSIS — Z4549 Encounter for adjustment and management of other implanted nervous system device: Secondary | ICD-10-CM | POA: Diagnosis present

## 2021-12-06 DIAGNOSIS — T85112A Breakdown (mechanical) of implanted electronic neurostimulator (electrode) of spinal cord, initial encounter: Secondary | ICD-10-CM | POA: Diagnosis not present

## 2021-12-06 HISTORY — PX: SPINAL CORD STIMULATOR REMOVAL: SHX5379

## 2021-12-06 SURGERY — LUMBAR SPINAL CORD STIMULATOR REMOVAL
Anesthesia: General | Site: Back

## 2021-12-06 MED ORDER — OXYCODONE HCL 5 MG PO TABS: 5.0000 mg | ORAL_TABLET | Freq: Once | ORAL | Status: DC | PRN

## 2021-12-06 MED ORDER — 0.9 % SODIUM CHLORIDE (POUR BTL) OPTIME
TOPICAL | Status: DC | PRN
  Administered 2021-12-06: 1000 mL

## 2021-12-06 MED ORDER — PROPOFOL 10 MG/ML IV BOLUS
INTRAVENOUS | Status: AC
  Filled 2021-12-06: qty 20

## 2021-12-06 MED ORDER — THROMBIN 5000 UNITS EX SOLR
CUTANEOUS | Status: AC
  Filled 2021-12-06: qty 5000

## 2021-12-06 MED ORDER — ACETAMINOPHEN 500 MG PO TABS
1000.0000 mg | ORAL_TABLET | Freq: Once | ORAL | Status: AC
  Administered 2021-12-06: 1000 mg via ORAL
  Filled 2021-12-06: qty 2

## 2021-12-06 MED ORDER — FENTANYL CITRATE (PF) 250 MCG/5ML IJ SOLN
INTRAMUSCULAR | Status: DC | PRN
  Administered 2021-12-06: 100 ug via INTRAVENOUS

## 2021-12-06 MED ORDER — MIDAZOLAM HCL 2 MG/2ML IJ SOLN
INTRAMUSCULAR | Status: AC
  Filled 2021-12-06: qty 2

## 2021-12-06 MED ORDER — LIDOCAINE 2% (20 MG/ML) 5 ML SYRINGE
INTRAMUSCULAR | Status: DC | PRN
  Administered 2021-12-06: 100 mg via INTRAVENOUS

## 2021-12-06 MED ORDER — CEFAZOLIN SODIUM-DEXTROSE 2-3 GM-%(50ML) IV SOLR
INTRAVENOUS | Status: DC | PRN
  Administered 2021-12-06: 2 g via INTRAVENOUS

## 2021-12-06 MED ORDER — FENTANYL CITRATE (PF) 100 MCG/2ML IJ SOLN
25.0000 ug | INTRAMUSCULAR | Status: DC | PRN
  Administered 2021-12-06: 50 ug via INTRAVENOUS

## 2021-12-06 MED ORDER — BUPIVACAINE HCL (PF) 0.25 % IJ SOLN
INTRAMUSCULAR | Status: DC | PRN
  Administered 2021-12-06: 7 mL

## 2021-12-06 MED ORDER — LIDOCAINE 2% (20 MG/ML) 5 ML SYRINGE
INTRAMUSCULAR | Status: AC
  Filled 2021-12-06: qty 5

## 2021-12-06 MED ORDER — OXYCODONE HCL 5 MG/5ML PO SOLN: 5.0000 mg | Freq: Once | ORAL | Status: DC | PRN

## 2021-12-06 MED ORDER — CEFAZOLIN SODIUM-DEXTROSE 2-4 GM/100ML-% IV SOLN: 2.0000 g | INTRAVENOUS | Status: DC

## 2021-12-06 MED ORDER — ONDANSETRON HCL 4 MG/2ML IJ SOLN
INTRAMUSCULAR | Status: AC
  Filled 2021-12-06: qty 2

## 2021-12-06 MED ORDER — AMISULPRIDE (ANTIEMETIC) 5 MG/2ML IV SOLN: 10.0000 mg | Freq: Once | INTRAVENOUS | Status: DC | PRN

## 2021-12-06 MED ORDER — VANCOMYCIN HCL 1000 MG IV SOLR
INTRAVENOUS | Status: AC
  Filled 2021-12-06: qty 20

## 2021-12-06 MED ORDER — ONDANSETRON HCL 4 MG/2ML IJ SOLN
INTRAMUSCULAR | Status: DC | PRN
  Administered 2021-12-06: 4 mg via INTRAVENOUS

## 2021-12-06 MED ORDER — ORAL CARE MOUTH RINSE: 15.0000 mL | Freq: Once | OROMUCOSAL | Status: AC

## 2021-12-06 MED ORDER — SUGAMMADEX SODIUM 200 MG/2ML IV SOLN
INTRAVENOUS | Status: DC | PRN
  Administered 2021-12-06: 200 mg via INTRAVENOUS

## 2021-12-06 MED ORDER — CEFAZOLIN SODIUM-DEXTROSE 2-4 GM/100ML-% IV SOLN
INTRAVENOUS | Status: AC
  Filled 2021-12-06: qty 100

## 2021-12-06 MED ORDER — MIDAZOLAM HCL 2 MG/2ML IJ SOLN
INTRAMUSCULAR | Status: DC | PRN
  Administered 2021-12-06: 2 mg via INTRAVENOUS

## 2021-12-06 MED ORDER — BUPIVACAINE HCL (PF) 0.25 % IJ SOLN
INTRAMUSCULAR | Status: AC
  Filled 2021-12-06: qty 30

## 2021-12-06 MED ORDER — LACTATED RINGERS IV SOLN: INTRAVENOUS | Status: DC

## 2021-12-06 MED ORDER — ROCURONIUM BROMIDE 10 MG/ML (PF) SYRINGE
PREFILLED_SYRINGE | INTRAVENOUS | Status: DC | PRN
  Administered 2021-12-06: 50 mg via INTRAVENOUS

## 2021-12-06 MED ORDER — DEXAMETHASONE SODIUM PHOSPHATE 10 MG/ML IJ SOLN
INTRAMUSCULAR | Status: AC
  Filled 2021-12-06: qty 1

## 2021-12-06 MED ORDER — ROCURONIUM BROMIDE 10 MG/ML (PF) SYRINGE
PREFILLED_SYRINGE | INTRAVENOUS | Status: AC
  Filled 2021-12-06: qty 10

## 2021-12-06 MED ORDER — PHENYLEPHRINE 80 MCG/ML (10ML) SYRINGE FOR IV PUSH (FOR BLOOD PRESSURE SUPPORT)
PREFILLED_SYRINGE | INTRAVENOUS | Status: AC
  Filled 2021-12-06: qty 10

## 2021-12-06 MED ORDER — CHLORHEXIDINE GLUCONATE 0.12 % MT SOLN
15.0000 mL | Freq: Once | OROMUCOSAL | Status: AC
  Administered 2021-12-06: 15 mL via OROMUCOSAL
  Filled 2021-12-06: qty 15

## 2021-12-06 MED ORDER — FENTANYL CITRATE (PF) 250 MCG/5ML IJ SOLN
INTRAMUSCULAR | Status: AC
  Filled 2021-12-06: qty 5

## 2021-12-06 MED ORDER — FENTANYL CITRATE (PF) 100 MCG/2ML IJ SOLN
INTRAMUSCULAR | Status: AC
  Filled 2021-12-06: qty 2

## 2021-12-06 MED ORDER — DEXAMETHASONE SODIUM PHOSPHATE 10 MG/ML IJ SOLN
INTRAMUSCULAR | Status: DC | PRN
  Administered 2021-12-06: 10 mg via INTRAVENOUS

## 2021-12-06 MED ORDER — PROPOFOL 10 MG/ML IV BOLUS
INTRAVENOUS | Status: DC | PRN
  Administered 2021-12-06: 150 mg via INTRAVENOUS

## 2021-12-06 SURGICAL SUPPLY — 38 items
BAG COUNTER SPONGE SURGICOUNT (BAG) ×1 IMPLANT
BAND RUBBER #18 3X1/16 STRL (MISCELLANEOUS) ×2 IMPLANT
BENZOIN TINCTURE PRP APPL 2/3 (GAUZE/BANDAGES/DRESSINGS) ×1 IMPLANT
CANISTER SUCT 3000ML PPV (MISCELLANEOUS) ×1 IMPLANT
DERMABOND ADVANCED .7 DNX12 (GAUZE/BANDAGES/DRESSINGS) IMPLANT
DRAPE C-ARM 42X72 X-RAY (DRAPES) ×2 IMPLANT
DRAPE LAPAROTOMY 100X72X124 (DRAPES) ×1 IMPLANT
DRAPE SURG 17X23 STRL (DRAPES) ×1 IMPLANT
DRSG OPSITE POSTOP 4X6 (GAUZE/BANDAGES/DRESSINGS) IMPLANT
DURAPREP 26ML APPLICATOR (WOUND CARE) ×1 IMPLANT
ELECT REM PT RETURN 9FT ADLT (ELECTROSURGICAL) ×1
ELECTRODE REM PT RTRN 9FT ADLT (ELECTROSURGICAL) ×1 IMPLANT
GAUZE 4X4 16PLY ~~LOC~~+RFID DBL (SPONGE) IMPLANT
GLOVE BIO SURGEON STRL SZ7 (GLOVE) IMPLANT
GLOVE BIO SURGEON STRL SZ8 (GLOVE) ×1 IMPLANT
GLOVE BIOGEL PI IND STRL 7.0 (GLOVE) IMPLANT
GOWN STRL REUS W/ TWL LRG LVL3 (GOWN DISPOSABLE) IMPLANT
GOWN STRL REUS W/ TWL XL LVL3 (GOWN DISPOSABLE) ×1 IMPLANT
GOWN STRL REUS W/TWL 2XL LVL3 (GOWN DISPOSABLE) IMPLANT
GOWN STRL REUS W/TWL LRG LVL3 (GOWN DISPOSABLE)
GOWN STRL REUS W/TWL XL LVL3 (GOWN DISPOSABLE) ×1
KIT BASIN OR (CUSTOM PROCEDURE TRAY) ×1 IMPLANT
KIT TURNOVER KIT B (KITS) ×1 IMPLANT
NDL HYPO 25X1 1.5 SAFETY (NEEDLE) ×1 IMPLANT
NDL SPNL 20GX3.5 QUINCKE YW (NEEDLE) IMPLANT
NEEDLE HYPO 25X1 1.5 SAFETY (NEEDLE) ×1 IMPLANT
NEEDLE SPNL 20GX3.5 QUINCKE YW (NEEDLE) IMPLANT
NS IRRIG 1000ML POUR BTL (IV SOLUTION) ×1 IMPLANT
PACK LAMINECTOMY NEURO (CUSTOM PROCEDURE TRAY) ×1 IMPLANT
PAD ARMBOARD 7.5X6 YLW CONV (MISCELLANEOUS) ×3 IMPLANT
STRIP CLOSURE SKIN 1/2X4 (GAUZE/BANDAGES/DRESSINGS) ×1 IMPLANT
SUT VIC AB 0 CT1 18XCR BRD8 (SUTURE) ×1 IMPLANT
SUT VIC AB 0 CT1 8-18 (SUTURE) ×1
SUT VIC AB 2-0 CP2 18 (SUTURE) ×1 IMPLANT
SUT VIC AB 3-0 SH 8-18 (SUTURE) ×1 IMPLANT
TOWEL GREEN STERILE (TOWEL DISPOSABLE) ×1 IMPLANT
TOWEL GREEN STERILE FF (TOWEL DISPOSABLE) ×1 IMPLANT
WATER STERILE IRR 1000ML POUR (IV SOLUTION) ×1 IMPLANT

## 2021-12-06 NOTE — Transfer of Care (Signed)
Immediate Anesthesia Transfer of Care Note  Patient: Mariah Harris  Procedure(s) Performed: Removal of spinal cord stimulator (Back)  Patient Location: PACU  Anesthesia Type:General  Level of Consciousness: drowsy and patient cooperative  Airway & Oxygen Therapy: Patient Spontanous Breathing and Patient connected to nasal cannula oxygen  Post-op Assessment: Report given to RN and Post -op Vital signs reviewed and stable  Post vital signs: Reviewed and stable  Last Vitals:  Vitals Value Taken Time  BP 119/75 12/06/21 1143  Temp    Pulse 77 12/06/21 1144  Resp 15 12/06/21 1144  SpO2 92 % 12/06/21 1144  Vitals shown include unvalidated device data.  Last Pain:  Vitals:   12/06/21 0904  TempSrc:   PainSc: 6       Patients Stated Pain Goal: 3 (55/73/22 0254)  Complications: No notable events documented.

## 2021-12-06 NOTE — Anesthesia Procedure Notes (Signed)
Procedure Name: Intubation Date/Time: 12/06/2021 10:54 AM  Performed by: Lance Coon, CRNAPre-anesthesia Checklist: Patient identified, Emergency Drugs available, Suction available, Patient being monitored and Timeout performed Patient Re-evaluated:Patient Re-evaluated prior to induction Oxygen Delivery Method: Circle system utilized Preoxygenation: Pre-oxygenation with 100% oxygen Induction Type: IV induction Ventilation: Mask ventilation without difficulty Laryngoscope Size: Miller and 3 Grade View: Grade I Tube type: Oral Tube size: 7.0 mm Number of attempts: 1 Airway Equipment and Method: Stylet Placement Confirmation: ETT inserted through vocal cords under direct vision, positive ETCO2 and breath sounds checked- equal and bilateral Secured at: 21 cm Tube secured with: Tape Dental Injury: Teeth and Oropharynx as per pre-operative assessment

## 2021-12-06 NOTE — Anesthesia Postprocedure Evaluation (Signed)
Anesthesia Post Note  Patient: Mariah Harris  Procedure(s) Performed: Removal of spinal cord stimulator (Back)     Patient location during evaluation: PACU Anesthesia Type: General Level of consciousness: awake and alert Pain management: pain level controlled Vital Signs Assessment: post-procedure vital signs reviewed and stable Respiratory status: spontaneous breathing, nonlabored ventilation and respiratory function stable Cardiovascular status: blood pressure returned to baseline Postop Assessment: no apparent nausea or vomiting Anesthetic complications: no   No notable events documented.  Last Vitals:  Vitals:   12/06/21 1300 12/06/21 1315  BP: (!) 141/93 121/78  Pulse: 75 67  Resp: (!) 22 13  Temp:  36.4 C  SpO2: 98% 95%    Last Pain:  Vitals:   12/06/21 1300  TempSrc:   PainSc: 10-Worst pain ever                 Marthenia Rolling

## 2021-12-06 NOTE — Op Note (Signed)
12/06/2021  11:38 AM  PATIENT:  Mariah Harris  46 y.o. female  PRE-OPERATIVE DIAGNOSIS:  spinal cord stimulator end of life  POST-OPERATIVE DIAGNOSIS:  same  PROCEDURE:  removal of spinal cord stimulator leads and pulse generator  SURGEON:  Sherley Bounds, MD  ASSISTANTSShelba Flake FNP  ANESTHESIA:   General  EBL: 5 ml  Total I/O In: 5 [IV Piggyback:50] Out: 5 [Blood:5]  BLOOD ADMINISTERED: none  DRAINS: none  SPECIMEN:  none  INDICATION FOR PROCEDURE: This patient presented with scs end of life. . The patient tried conservative measures without relief. Pain was debilitating. Recommended removal of SCS. Patient understood the risks, benefits, and alternatives and potential outcomes and wished to proceed.  PROCEDURE DETAILS: The patient was taken to the operating room and after induction of adequate generalized endotracheal anesthesia she was rolled into the prone position on chest rolls and all pressure points were padded.  Her lumbar region was cleaned with Betadine scrub and then prepped with DuraPrep and then draped in the usual sterile fashion.  10 cc of local anesthesia was injected and incision was made through the old incisions over the pulse generator in the left flank and in the midline over the tiedown's of the leads.  Identified where the leads were tied down and cut the sutures and then removed to the leads from the epidural space.  I then cut the leads at this incision on the distal side of the tiedown.  Open the incision over the pulse generator and remove the pulse generator from the pocket and then pulled the leads through from the lumbar incision to the flank incision and remove the entire pulse generator and the remainder of the leads together.  Therefore, the entire spinal cord stimulator pulse generator and leads were removed.  I irrigated the wounds with bacitracin containing saline solution.  I closed in layers of 2-0 Vicryl in the subcutaneous tissues and 3-0 Vicryl  in the subcu cuticular tissues.  The skin was closed with benzoin and Steri-Strips.  Sterile dressings were applied.  The drapes were removed.  The patient was awakened and taken to the recovery room in stable condition.  At the end of the procedure all sponge needle and instrument counts were correct.  My nurse practitioner was scrubbed and helped with removal of the stimulator and closure of the wounds.   PLAN OF CARE: Discharge to home after PACU  PATIENT DISPOSITION:  PACU - hemodynamically stable.   Delay start of Pharmacological VTE agent (>24hrs) due to surgical blood loss or risk of bleeding:  yes

## 2021-12-06 NOTE — Anesthesia Preprocedure Evaluation (Addendum)
Anesthesia Evaluation  Patient identified by MRN, date of birth, ID band Patient awake    Reviewed: Allergy & Precautions, NPO status , Patient's Chart, lab work & pertinent test results  History of Anesthesia Complications Negative for: history of anesthetic complications  Airway Mallampati: II  TM Distance: >3 FB Neck ROM: Full    Dental  (+) Edentulous Upper, Edentulous Lower   Pulmonary Current Smoker and Patient abstained from smoking.,    Pulmonary exam normal        Cardiovascular negative cardio ROS Normal cardiovascular exam     Neuro/Psych  Headaches, Anxiety Depression Bipolar Disorder    GI/Hepatic Neg liver ROS, GERD  Medicated and Controlled,  Endo/Other  negative endocrine ROS  Renal/GU   negative genitourinary   Musculoskeletal   Abdominal   Peds  Hematology negative hematology ROS (+)   Anesthesia Other Findings Day of surgery medications reviewed with patient.  Reproductive/Obstetrics                            Anesthesia Physical Anesthesia Plan  ASA: 2  Anesthesia Plan: General   Post-op Pain Management: Tylenol PO (pre-op)*   Induction: Intravenous  PONV Risk Score and Plan: 2 and Treatment may vary due to age or medical condition, Midazolam, Ondansetron and Dexamethasone  Airway Management Planned: Oral ETT  Additional Equipment: None  Intra-op Plan:   Post-operative Plan: Extubation in OR  Informed Consent: I have reviewed the patients History and Physical, chart, labs and discussed the procedure including the risks, benefits and alternatives for the proposed anesthesia with the patient or authorized representative who has indicated his/her understanding and acceptance.     Dental advisory given  Plan Discussed with: CRNA  Anesthesia Plan Comments:        Anesthesia Quick Evaluation

## 2021-12-06 NOTE — H&P (Signed)
Subjective: Patient is a 46 y.o. female admitted for movable of her spinal cord stimulator.  She was placed in the remote past by another surgeon.  It no longer works she feels it causes pain.  It sounds like it was infected at one point and was revised.  Onset of symptoms was a few months ago, unchanged since that time.  The pain is rated severe, and is located at the across the lower back and radiates to legs. The pain is described as aching and occurs all day. The symptoms have been progressive. Symptoms are exacerbated by nothing in particular.   Past Medical History:  Diagnosis Date   ADHD (attention deficit hyperactivity disorder)    Anxiety    Back pain    Bipolar 1 disorder (HCC)    Depression    GERD (gastroesophageal reflux disease)    Migraine    Personality disorder (HCC)    Sciatica     Past Surgical History:  Procedure Laterality Date   ABDOMINAL HYSTERECTOMY     APPENDECTOMY     removed at 46 years old   BACK SURGERY  2023   spinal cord stimulator implant ( pt had stimulator inserted, removed 2022 and reinserted 2023)   POSTERIOR LAMINECTOMY / DECOMPRESSION LUMBAR SPINE Bilateral 03/09/2014   TUBAL LIGATION      Prior to Admission medications   Medication Sig Start Date End Date Taking? Authorizing Provider  AIMOVIG 70 MG/ML SOAJ Inject 70 mg into the skin every 30 (thirty) days. 10/26/21  Yes [provider]  albuterol (VENTOLIN HFA) 108 (90 Base) MCG/ACT inhaler Inhale 1-2 puffs into the lungs every 6 (six) hours as needed for wheezing or shortness of breath.   Yes [provider]  budesonide-formoterol (SYMBICORT) 80-4.5 MCG/ACT inhaler Inhale 2 puffs into the lungs daily as needed (shortness of breath).   Yes [provider]  cyclobenzaprine (FLEXERIL) 10 MG tablet Take 10 mg by mouth 3 (three) times daily as needed for muscle spasms. 10/25/21  Yes [provider]  DULoxetine (CYMBALTA) 60 MG capsule Take 60 mg by mouth 2 (two) times  daily.   Yes [provider]  esomeprazole (NEXIUM) 40 MG capsule Take 40 mg by mouth daily. 10/09/21  Yes [provider]  lidocaine (LIDODERM) 5 % Place 1 patch onto the skin daily. Remove & Discard patch within 12 hours or as directed by MD Patient taking differently: Place 1 patch onto the skin daily as needed (pain). Remove & Discard patch within 12 hours or as directed by MD 10/25/15  Yes Joy, Shawn C, PA-C  Lurasidone HCl 60 MG TABS Take 60 mg by mouth at bedtime. 10/09/21  Yes [provider]  methocarbamol (ROBAXIN) 500 MG tablet Take 1 tablet (500 mg total) by mouth 2 (two) times daily. 07/08/20  Yes Ford, Kelsey N, PA-C  NURTEC 75 MG TBDP Take 75 mg by mouth daily as needed (migraine). 10/07/21  Yes [provider]  Oxcarbazepine (TRILEPTAL) 300 MG tablet Take 300 mg by mouth 2 (two) times daily. 10/25/21  Yes [provider]  oxyCODONE-acetaminophen (PERCOCET) 10-325 MG tablet Take 1 tablet by mouth 5 (five) times daily as needed. 10/07/21  Yes [provider]  pregabalin (LYRICA) 100 MG capsule Take 100 mg by mouth 3 (three) times daily. 08/04/21  Yes [provider]  clindamycin (CLEOCIN) 300 MG capsule Take 1 capsule (300 mg total) by mouth 3 (three) times daily. Patient not taking: Reported on 11/16/2021 07/03/19  Gwyneth Sprout, MD  gabapentin (NEURONTIN) 300 MG capsule Take 1 capsule (300 mg total) by mouth 3 (three) times daily. Patient not taking: Reported on 11/16/2021 10/25/15   Anselm Pancoast, PA-C  methylPREDNISolone (MEDROL DOSEPAK) 4 MG TBPK tablet Take as directed Patient not taking: Reported on 11/16/2021 07/08/20   Dartha Lodge, PA-C  naproxen (NAPROSYN) 375 MG tablet Take 1 tablet (375 mg total) by mouth 2 (two) times daily with a meal. Patient not taking: Reported on 11/16/2021 11/29/20   Arthor Captain, PA-C  ondansetron (ZOFRAN ODT) 8 MG disintegrating tablet Take 1 tablet (8 mg total) by mouth every 8 (eight) hours as  needed for nausea or vomiting. Patient not taking: Reported on 11/16/2021 11/15/15   Molpus, Jonny Ruiz, MD  oxyCODONE-acetaminophen (PERCOCET) 5-325 MG tablet Take 1-2 tablets by mouth every 6 (six) hours as needed for severe pain (for pain). Patient not taking: Reported on 11/16/2021 07/03/19   Gwyneth Sprout, MD  promethazine (PHENERGAN) 25 MG tablet Take 1 tablet (25 mg total) by mouth every 6 (six) hours as needed for nausea or vomiting. Patient not taking: Reported on 11/16/2021 11/23/19   Horton, Mayer Masker, MD  SSD 1 % cream Apply 1 Application topically daily as needed (shingles). 08/04/21   [provider]   Allergies  Allergen Reactions   Asa [Aspirin] Shortness Of Breath    Social History   Tobacco Use   Smoking status: Some Days    Types: Cigarettes   Smokeless tobacco: Never  Substance Use Topics   Alcohol use: No    History reviewed. No pertinent family history.   Review of Systems  Positive ROS: neg  All other systems have been reviewed and were otherwise negative with the exception of those mentioned in the HPI and as above.  Objective: Vital signs in last 24 hours: Temp:  [99.2 F (37.3 C)] 99.2 F (37.3 C) (09/27 0850) Pulse Rate:  [84] 84 (09/27 0850) Resp:  [18] 18 (09/27 0850) BP: (121)/(90) 121/90 (09/27 0850) SpO2:  [99 %] 99 % (09/27 0850) Weight:  [74.8 kg] 74.8 kg (09/27 0850)  General Appearance: Alert, cooperative, no distress, appears stated age Head: Normocephalic, without obvious abnormality, atraumatic Eyes: PERRL, conjunctiva/corneas clear, EOM's intact    Neck: Supple, symmetrical, trachea midline Back: Symmetric, no curvature, ROM normal, no CVA tenderness Lungs:  respirations unlabored Heart: Regular rate and rhythm Abdomen: Soft, non-tender Extremities: Extremities normal, atraumatic, no cyanosis or edema Pulses: 2+ and symmetric all extremities Skin: Skin color, texture, turgor normal, no rashes or lesions  NEUROLOGIC:   Mental  status: Alert and oriented x4,  no aphasia, good attention span, fund of knowledge, and memory Motor Exam - grossly normal Sensory Exam - grossly normal Reflexes: 1+ Coordination - grossly normal Gait - grossly normal Balance - grossly normal Cranial Nerves: I: smell Not tested  II: visual acuity  OS: nl    OD: nl  II: visual fields Full to confrontation  II: pupils Equal, round, reactive to light  III,VII: ptosis None  III,IV,VI: extraocular muscles  Full ROM  V: mastication Normal  V: facial light touch sensation  Normal  V,VII: corneal reflex  Present  VII: facial muscle function - upper  Normal  VII: facial muscle function - lower Normal  VIII: hearing Not tested  IX: soft palate elevation  Normal  IX,X: gag reflex Present  XI: trapezius strength  5/5  XI: sternocleidomastoid strength 5/5  XI: neck flexion strength  5/5  XII: tongue  strength  Normal    Data Review Lab Results  Component Value Date   WBC 10.5 12/04/2021   HGB 14.9 12/04/2021   HCT 45.4 12/04/2021   MCV 89.7 12/04/2021   PLT 383 12/04/2021   Lab Results  Component Value Date   NA 138 12/04/2021   K 4.5 12/04/2021   CL 107 12/04/2021   CO2 20 (L) 12/04/2021   BUN 8 12/04/2021   CREATININE 1.01 (H) 12/04/2021   GLUCOSE 92 12/04/2021   No results found for: "INR", "PROTIME"  Assessment/Plan:  Estimated body mass index is 30.18 kg/m as calculated from the following:   Height as of this encounter: 5\' 2"  (1.575 m).   Weight as of this encounter: 74.8 kg. Patient admitted for removal of her spinal cord stimulator. Patient has failed a reasonable attempt at conservative therapy.  I explained the condition and procedure to the patient and answered any questions.  Patient wishes to proceed with procedure as planned. Understands risks/ benefits and typical outcomes of procedure.   Eustace Moore 12/06/2021 10:26 AM

## 2021-12-07 ENCOUNTER — Encounter (HOSPITAL_COMMUNITY): Payer: Self-pay | Admitting: Neurological Surgery

## 2022-05-28 IMAGING — DX DG KNEE COMPLETE 4+V*L*
4 series · 4 of 4 positions shown · non-contrast
Comparison: None.

CLINICAL DATA: Twisting injury 2 days ago with persistent pain and
limited motion, initial encounter

EXAM:
LEFT KNEE - COMPLETE 4+ VIEW

[knee ap]
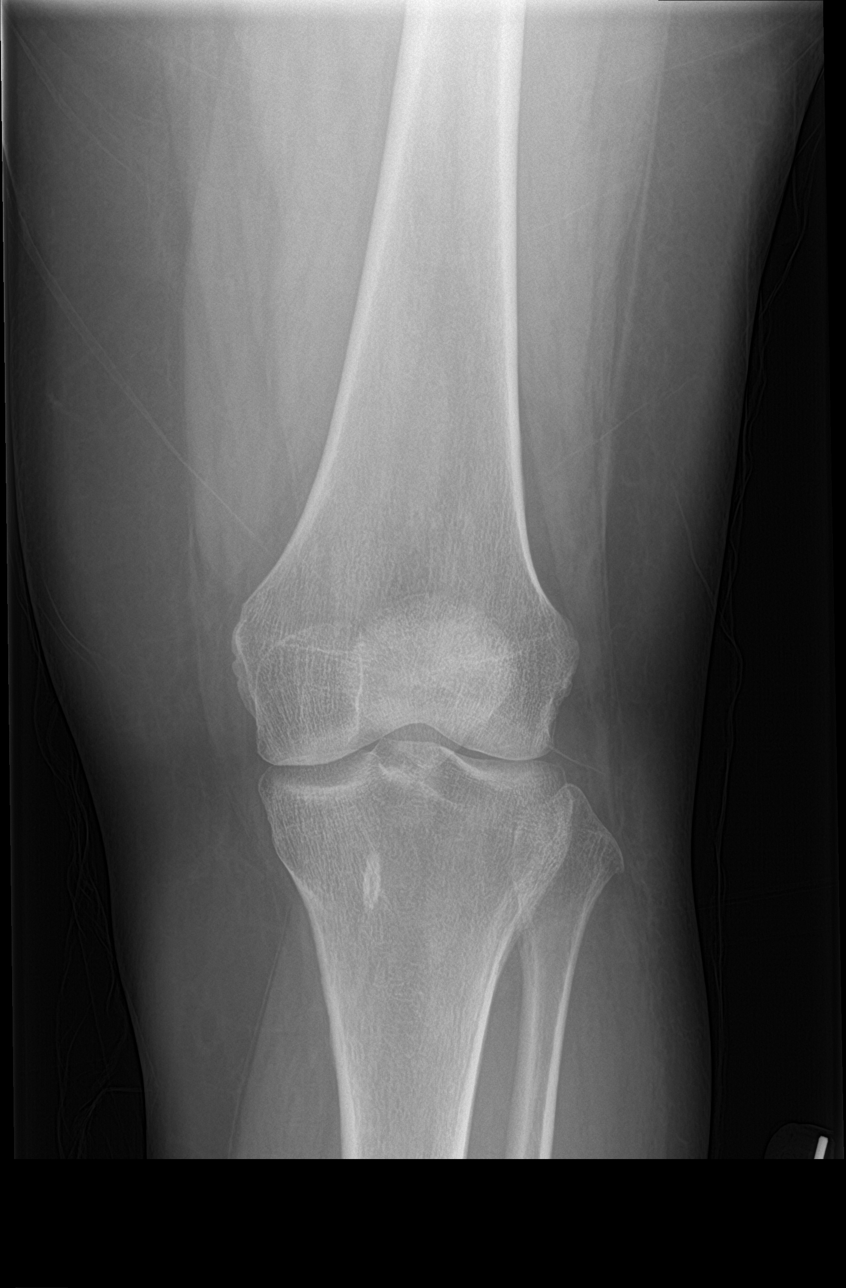

[knee lat]
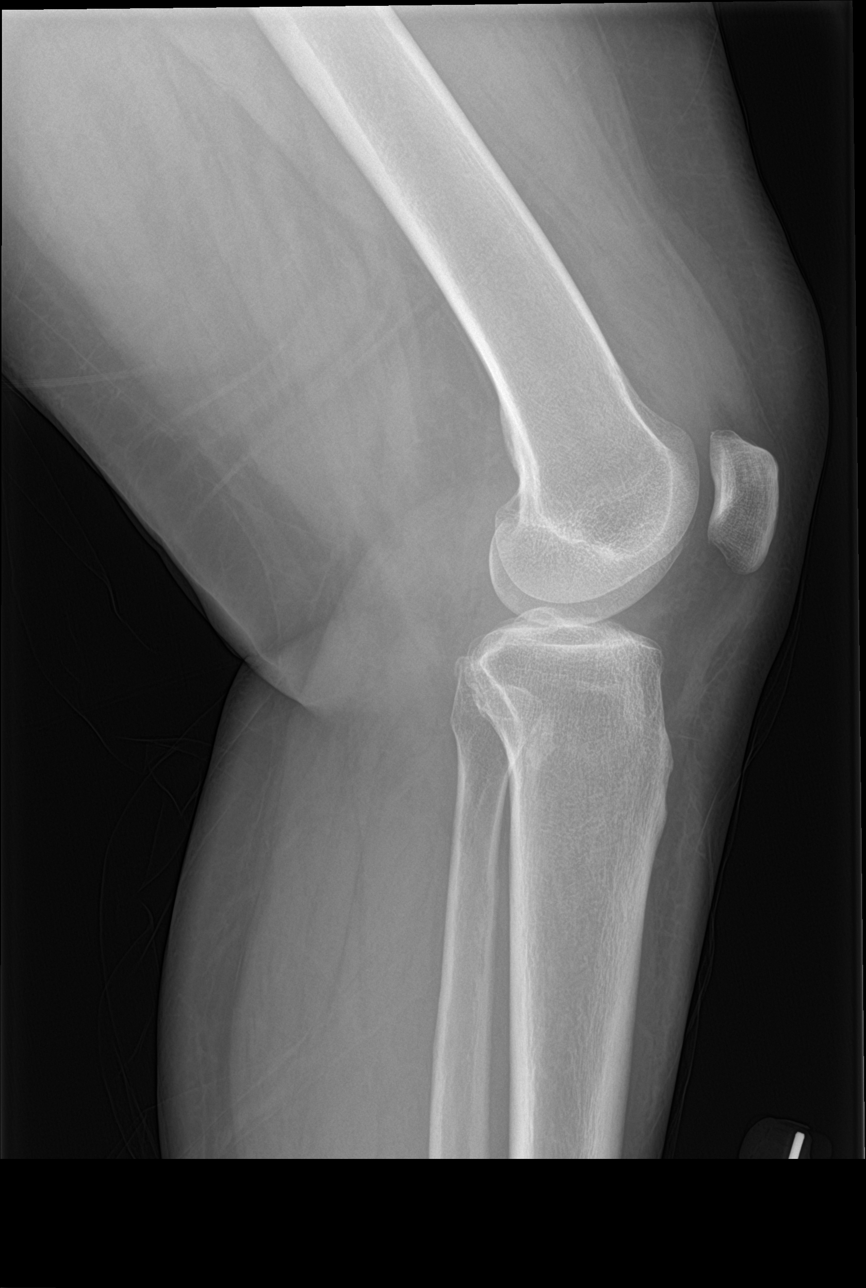

[knee obl (1 of 2)]
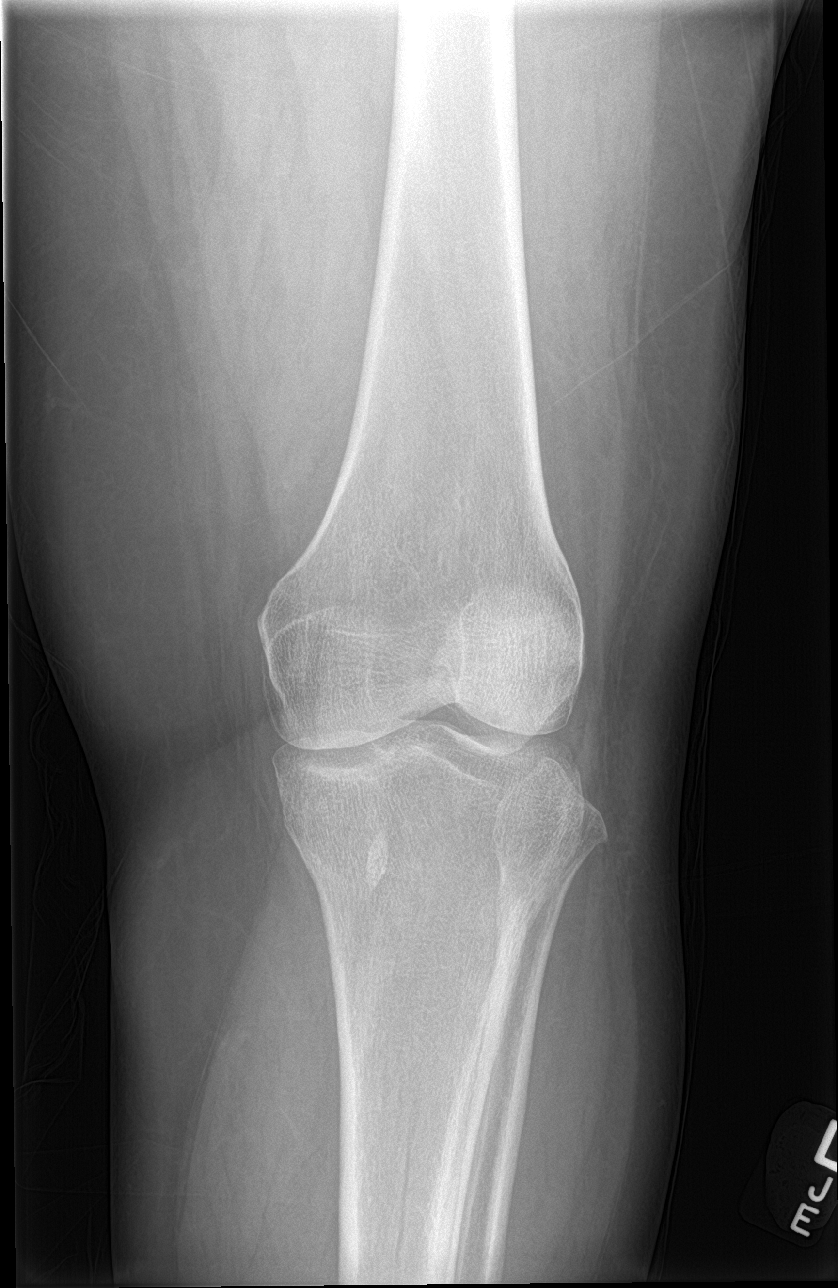

[knee obl (2 of 2)]
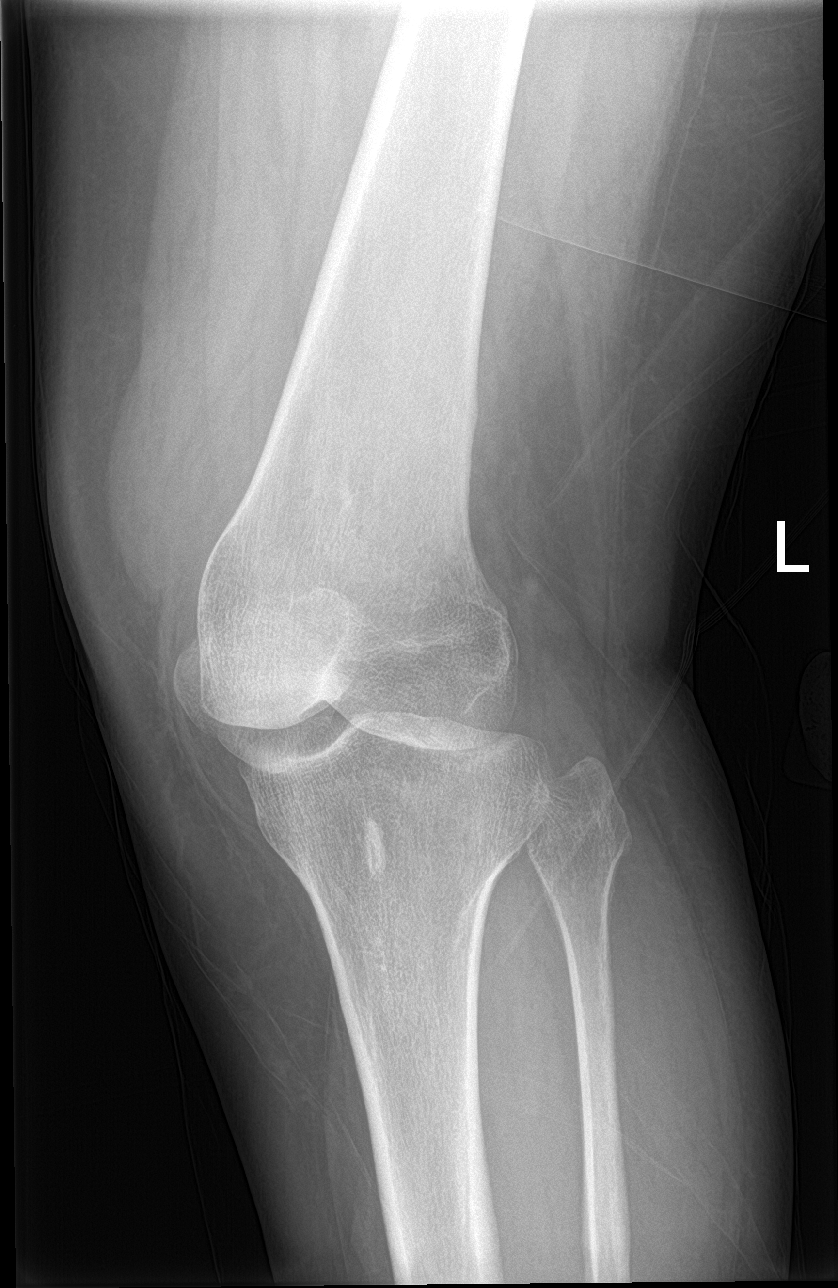

[4 of 4 positions shown; findings below may reference images not displayed]

FINDINGS: No acute fracture or dislocation is noted. Minimal joint effusion
and suprapatellar soft tissue swelling is seen. No other focal
abnormality is noted.
IMPRESSION: No evidence of acute fracture or dislocation.

Small joint effusion and soft tissue swelling is noted.

## 2022-06-25 ENCOUNTER — Encounter: Payer: Self-pay | Admitting: *Deleted
